# Patient Record
Sex: Male | Born: 1970 | Race: Black or African American | Hispanic: No | Marital: Single | Smoking: Never smoker
Health system: Southern US, Community
[De-identification: ages and names within clinical notes are randomized; demographics above are authoritative.]

## PROBLEM LIST (undated history)

## (undated) DIAGNOSIS — L719 Rosacea, unspecified: Secondary | ICD-10-CM

## (undated) DIAGNOSIS — D573 Sickle-cell trait: Secondary | ICD-10-CM

---
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
## 1999-11-07 ENCOUNTER — Encounter: Payer: Self-pay | Admitting: Emergency Medicine

## 1999-11-07 ENCOUNTER — Emergency Department (HOSPITAL_COMMUNITY): Admission: EM | Admit: 1999-11-07 | Discharge: 1999-11-07 | Payer: Self-pay | Admitting: Emergency Medicine

## 2001-08-26 ENCOUNTER — Emergency Department (HOSPITAL_COMMUNITY): Admission: EM | Admit: 2001-08-26 | Discharge: 2001-08-26 | Payer: Self-pay | Admitting: *Deleted

## 2006-05-06 ENCOUNTER — Emergency Department (HOSPITAL_COMMUNITY): Admission: EM | Admit: 2006-05-06 | Discharge: 2006-05-07 | Payer: Self-pay | Admitting: Emergency Medicine

## 2012-07-19 ENCOUNTER — Emergency Department (HOSPITAL_COMMUNITY): Payer: Federal, State, Local not specified - PPO

## 2012-07-19 ENCOUNTER — Encounter (HOSPITAL_COMMUNITY): Payer: Self-pay | Admitting: Emergency Medicine

## 2012-07-19 ENCOUNTER — Emergency Department (HOSPITAL_COMMUNITY)
Admission: EM | Admit: 2012-07-19 | Discharge: 2012-07-19 | Disposition: A | Discharge status: Home / Self Care | Payer: Federal, State, Local not specified - PPO | Attending: Emergency Medicine | Admitting: Emergency Medicine

## 2012-07-19 DIAGNOSIS — Z792 Long term (current) use of antibiotics: Secondary | ICD-10-CM | POA: Insufficient documentation

## 2012-07-19 DIAGNOSIS — R079 Chest pain, unspecified: Secondary | ICD-10-CM | POA: Insufficient documentation

## 2012-07-19 DIAGNOSIS — Z79899 Other long term (current) drug therapy: Secondary | ICD-10-CM | POA: Insufficient documentation

## 2012-07-19 DIAGNOSIS — R1011 Right upper quadrant pain: Secondary | ICD-10-CM | POA: Insufficient documentation

## 2012-07-19 DIAGNOSIS — R109 Unspecified abdominal pain: Secondary | ICD-10-CM

## 2012-07-19 DIAGNOSIS — Z202 Contact with and (suspected) exposure to infections with a predominantly sexual mode of transmission: Secondary | ICD-10-CM | POA: Insufficient documentation

## 2012-07-19 LAB — CBC
MCV: 82.6 fL (ref 78.0–100.0)
Platelets: 167 10*3/uL (ref 150–400)
RBC: 5.16 MIL/uL (ref 4.22–5.81)
WBC: 7.7 10*3/uL (ref 4.0–10.5)

## 2012-07-19 LAB — BASIC METABOLIC PANEL
CO2: 31 mEq/L (ref 19–32)
Chloride: 102 mEq/L (ref 96–112)
Creatinine, Ser: 1.09 mg/dL (ref 0.50–1.35)
GFR calc Af Amer: >90 mL/min (ref >90)
Potassium: 4 mEq/L (ref 3.5–5.1)
Sodium: 140 mEq/L (ref 135–145)

## 2012-07-19 LAB — POCT I-STAT TROPONIN I: Troponin i, poc: 0 ng/mL (ref 0.00–0.08)

## 2012-07-19 LAB — HEPATIC FUNCTION PANEL
ALT: 32 U/L (ref 0–53)
AST: 34 U/L (ref 0–37)
Albumin: 3.7 g/dL (ref 3.5–5.2)
Total Protein: 7 g/dL (ref 6.0–8.3)

## 2012-07-19 LAB — D-DIMER, QUANTITATIVE: D-Dimer, Quant: 0.41 ug/mL-FEU (ref 0.00–0.48)

## 2012-07-19 MED ORDER — SODIUM CHLORIDE 0.9 % IV SOLN
INTRAVENOUS | Status: DC
  Administered 2012-07-19: 10:00:00 via INTRAVENOUS

## 2012-07-19 MED ORDER — CEFTRIAXONE SODIUM 250 MG IJ SOLR
250.0000 mg | Freq: Once | INTRAMUSCULAR | Status: AC
  Administered 2012-07-19: 250 mg via INTRAMUSCULAR
  Filled 2012-07-19: qty 250

## 2012-07-19 MED ORDER — AZITHROMYCIN 1 G PO PACK
1.0000 g | PACK | Freq: Once | ORAL | Status: AC
  Administered 2012-07-19: 1 g via ORAL
  Filled 2012-07-19: qty 1

## 2012-07-19 MED ORDER — MORPHINE SULFATE 4 MG/ML IJ SOLN: 4.0000 mg | INTRAMUSCULAR | Status: DC | PRN

## 2012-07-19 MED ORDER — ONDANSETRON HCL 4 MG/2ML IJ SOLN: 4.0000 mg | INTRAMUSCULAR | Status: DC | PRN

## 2012-07-19 MED ORDER — HYDROCODONE-ACETAMINOPHEN 5-325 MG PO TABS: ORAL_TABLET | ORAL | Status: DC

## 2012-07-19 NOTE — ED Notes (Signed)
Pt c/o right sided CP that is in rib area and worse with inspiration starting yesterday; pt sts sharp in nature

## 2012-07-19 NOTE — ED Provider Notes (Signed)
History     CSN: [id number]  Arrival date & time 07/19/12  4098   First MD Initiated Contact with Patient 07/19/12 [phone]      Chief Complaint  Patient presents with  . Chest Pain     HPI Pt was seen at 1000.   Per pt, c/o gradual onset and persistence of multiple intermittent episodes of RUQ abd "pain" since yesterday.  States the pain began shortly after "drinking a protein shake" yesterday morning when he came home from working out at the gym.  Pt states the pain resolved a short while later. Pt states the pain returned later on that evening, again after drinking a protein shake after coming home from the gym.  Describes the pain as "sharp," and radiates into his right lower chest area. Denies vomiting/diarrhea, no fevers, no back/flank pain, no rash, no CP/SOB, no black or blood in stools or emesis.       History reviewed. No pertinent past medical history.  History reviewed. No pertinent past surgical history.    History  Substance Use Topics  . Smoking status: Never Smoker   . Smokeless tobacco: Not on file  . Alcohol Use: Yes     Comment: occ      Review of Systems ROS: Statement: All systems negative except as marked or noted in the HPI; Constitutional: Negative for fever and chills. ; ; Eyes: Negative for eye pain, redness and discharge. ; ; ENMT: Negative for ear pain, hoarseness, nasal congestion, sinus pressure and sore throat. ; ; Cardiovascular: Negative for chest pain, palpitations, diaphoresis, dyspnea and peripheral edema. ; ; Respiratory: Negative for cough, wheezing and stridor. ; ; Gastrointestinal: +abd pain. Negative for nausea, vomiting, diarrhea, blood in stool, hematemesis, jaundice and rectal bleeding. . ; ; Genitourinary: Negative for dysuria, flank pain and hematuria. ; ; Genital:  No penile drainage or rash, no testicular pain or swelling, no scrotal rash or swelling.;; Musculoskeletal: Negative for back pain and neck pain. Negative for swelling and  trauma.; ; Skin: Negative for pruritus, rash, abrasions, blisters, bruising and skin lesion.; ; Neuro: Negative for headache, lightheadedness and neck stiffness. Negative for weakness, altered level of consciousness , altered mental status, extremity weakness, paresthesias, involuntary movement, seizure and syncope.       Allergies  Review of patient's allergies indicates no known allergies.  Home Medications   Current Outpatient Rx  Name  Route  Sig  Dispense  Refill  . amoxicillin (AMOXIL) 500 MG capsule   Oral   Take 500 mg by mouth every 8 (eight) hours.         Marland Kitchen ibuprofen (ADVIL,MOTRIN) 200 MG tablet   Oral   Take 400 mg by mouth daily as needed (Inflammation).         Boris Lown Oil 500 MG CAPS   Oral   Take 1 capsule by mouth daily.         Marland Kitchen OVER THE COUNTER MEDICATION      200 mg. Tetris tribulus herbal supplement--testosterone booster for bodybuilding         . OVER THE COUNTER MEDICATION      3 drops 3 (three) times daily. Deer antler oil         . VITAMIN E PO   Oral   Take 1 tablet by mouth daily.           BP 117/67  Pulse 61  Temp(Src) 97.7 F (36.5 C) (Oral)  Resp 17  SpO2 97%  Physical Exam 1005: Physical examination:  Nursing notes reviewed; Vital signs and O2 SAT reviewed;  Constitutional: Well developed, Well nourished, Well hydrated, In no acute distress; Head:  Normocephalic, atraumatic; Eyes: EOMI, PERRL, No scleral icterus; ENMT: Mouth and pharynx normal, Mucous membranes moist; Neck: Supple, Full range of motion, No lymphadenopathy; Cardiovascular: Regular rate and rhythm, No murmur, rub, or gallop; Respiratory: Breath sounds clear & equal bilaterally, No rales, rhonchi, wheezes.  Speaking full sentences with ease, Normal respiratory effort/excursion; Chest: Nontender, Movement normal; Abdomen: Soft, +RUQ tenderness to palp. No rebound or guarding. Nondistended, Normal bowel sounds; Genitourinary: No CVA tenderness; Extremities: Pulses  normal, No tenderness, No edema, No calf edema or asymmetry.; Neuro: AA&Ox3, Major CN grossly intact.  Speech clear. No gross focal motor or sensory deficits in extremities.; Skin: Color normal, Warm, Dry.   ED Course  Procedures    MDM  MDM Reviewed: previous chart, nursing note and vitals Interpretation: ECG, labs, ultrasound and x-ray    Date: 07/19/2012  Rate: 71  Rhythm: normal sinus rhythm  QRS Axis: normal  Intervals: normal  ST/T Wave abnormalities: normal  Conduction Disutrbances:none  Narrative Interpretation:   Old EKG Reviewed: none available.  Results for orders placed during the hospital encounter of 07/19/12  CBC      Result Value Range   WBC 7.7  4.0 - 10.5 K/uL   RBC 5.16  4.22 - 5.81 MIL/uL   Hemoglobin 15.4  13.0 - 17.0 g/dL   HCT 16.1  09.6 - 04.5 %   MCV 82.6  78.0 - 100.0 fL   MCH 29.8  26.0 - 34.0 pg   MCHC 36.2 (*) 30.0 - 36.0 g/dL   RDW 40.9  81.1 - 91.4 %   Platelets 167  150 - 400 K/uL  BASIC METABOLIC PANEL      Result Value Range   Sodium 140  135 - 145 mEq/L   Potassium 4.0  3.5 - 5.1 mEq/L   Chloride 102  96 - 112 mEq/L   CO2 31  19 - 32 mEq/L   Glucose, Bld 99  70 - 99 mg/dL   BUN 20  6 - 23 mg/dL   Creatinine, Ser 7.82  0.50 - 1.35 mg/dL   Calcium 9.5  8.4 - 95.6 mg/dL   GFR calc non Af Amer 83 (*) >90 mL/min   GFR calc Af Amer >90  >90 mL/min  D-DIMER, QUANTITATIVE      Result Value Range   D-Dimer, Quant 0.41  0.00 - 0.48 ug/mL-FEU  HEPATIC FUNCTION PANEL      Result Value Range   Total Protein 7.0  6.0 - 8.3 g/dL   Albumin 3.7  3.5 - 5.2 g/dL   AST 34  0 - 37 U/L   ALT 32  0 - 53 U/L   Alkaline Phosphatase 50  39 - 117 U/L   Total Bilirubin 0.6  0.3 - 1.2 mg/dL   Bilirubin, Direct 0.1  0.0 - 0.3 mg/dL   Indirect Bilirubin 0.5  0.3 - 0.9 mg/dL  LIPASE, BLOOD      Result Value Range   Lipase 24  11 - 59 U/L  POCT I-STAT TROPONIN I      Result Value Range   Troponin i, poc 0.00  0.00 - 0.08 ng/mL   Comment 3             Dg Chest 2 View 07/19/2012   *RADIOLOGY REPORT*  Clinical Data: Lower chest pain  CHEST -  2 VIEW  Comparison: None.  Findings: Low volume chest exam.  Normal heart size and vascularity.  No pneumonia, CHF, effusion or pneumothorax.  Trachea midline.  IMPRESSION: Low volume exam.  No acute finding   Original Report Authenticated By: Judie Petit. Miles Costain, M.D.   US Abdomen Complete 07/19/2012   *RADIOLOGY REPORT*  Clinical Data:  Right upper quadrant pain  ABDOMINAL ULTRASOUND COMPLETE  Comparison:  None.  Findings:  Gallbladder:  No gallstones, gallbladder wall thickening, or pericholecystic fluid.  Common Bile Duct:  Within normal limits in caliber.  Liver: No focal mass lesion identified.  Within normal limits in parenchymal echogenicity.  IVC:  Appears normal.  Pancreas:  Completely obscured by midline bowel gas.  Spleen:  Within normal limits in size and echotexture.  Right kidney:  Normal in size and parenchymal echogenicity.  No evidence of mass or hydronephrosis.  Left kidney:  Normal in size and parenchymal echogenicity.  No evidence of mass or hydronephrosis.  Abdominal Aorta:  No aneurysm identified.  IMPRESSION: Negative for gallstones.  Normal gallbladder.  Obscured pancreas by bowel gas  No acute finding by ultrasound   Original Report Authenticated By: Judie Petit. Shick, M.D.    1450:  Pt states that he found out "just now" that his significant other "is being treated for an STD" and is requesting testing and tx while he is here today. Does now endorse that "sometimes it itches when I pee."  Denies testicular pain/swelling, no penile drainage, no dysuria/hematuria. With male ED Tech chaperone present, GC/chlam obtained and sent to the lab.  Pt has tol PO well while in the ED without N/V.  No stooling while in the ED.  Abd benign, VSS. Wants to go home now. Pt requesting to be tx prophylactically for STD exposure today. Dx and testing d/w pt.  Questions answered.  Verb understanding, agreeable to d/c home with outpt  f/u.              Laray Anger, DO 07/21/12 2251

## 2012-07-20 LAB — GC/CHLAMYDIA PROBE AMP: CT Probe RNA: NEGATIVE

## 2012-10-15 ENCOUNTER — Emergency Department (HOSPITAL_COMMUNITY)
Admission: EM | Admit: 2012-10-15 | Discharge: 2012-10-15 | Disposition: A | Discharge status: Home / Self Care | Payer: Federal, State, Local not specified - PPO | Attending: Emergency Medicine | Admitting: Emergency Medicine

## 2012-10-15 ENCOUNTER — Emergency Department (HOSPITAL_COMMUNITY): Payer: Federal, State, Local not specified - PPO

## 2012-10-15 ENCOUNTER — Encounter (HOSPITAL_COMMUNITY): Payer: Self-pay | Admitting: Emergency Medicine

## 2012-10-15 DIAGNOSIS — Z79899 Other long term (current) drug therapy: Secondary | ICD-10-CM | POA: Insufficient documentation

## 2012-10-15 DIAGNOSIS — Z23 Encounter for immunization: Secondary | ICD-10-CM | POA: Insufficient documentation

## 2012-10-15 DIAGNOSIS — S91309A Unspecified open wound, unspecified foot, initial encounter: Secondary | ICD-10-CM | POA: Insufficient documentation

## 2012-10-15 DIAGNOSIS — W208XXA Other cause of strike by thrown, projected or falling object, initial encounter: Secondary | ICD-10-CM | POA: Insufficient documentation

## 2012-10-15 DIAGNOSIS — Y92009 Unspecified place in unspecified non-institutional (private) residence as the place of occurrence of the external cause: Secondary | ICD-10-CM | POA: Insufficient documentation

## 2012-10-15 DIAGNOSIS — Y9389 Activity, other specified: Secondary | ICD-10-CM | POA: Insufficient documentation

## 2012-10-15 MED ORDER — TETANUS-DIPHTH-ACELL PERTUSSIS 5-2.5-18.5 LF-MCG/0.5 IM SUSP
0.5000 mL | Freq: Once | INTRAMUSCULAR | Status: AC
  Administered 2012-10-15: 0.5 mL via INTRAMUSCULAR
  Filled 2012-10-15: qty 0.5

## 2012-10-15 MED ORDER — CEPHALEXIN 500 MG PO CAPS: 500.0000 mg | ORAL_CAPSULE | Freq: Four times a day (QID) | ORAL | Status: DC

## 2012-10-15 MED ORDER — IBUPROFEN 600 MG PO TABS: 600.0000 mg | ORAL_TABLET | Freq: Four times a day (QID) | ORAL | Status: DC | PRN

## 2012-10-15 NOTE — ED Provider Notes (Signed)
CSN: [id number]     Arrival date & time 10/15/12  0306 History     First MD Initiated Contact with Patient 10/15/12 0345     Chief Complaint  Patient presents with  . Foot Injury   (Consider location/radiation/quality/duration/timing/severity/associated sxs/prior Treatment) HPI History provided by patient. At home tonight and dropped a ceramic plate onto his left foot. He was not wearing shoes and he sustained a small laceration over the dorsal medial aspect. No foreign body sensation. Bleeding controlled prior to arrival. Patient concerned because he is unable to move his left great toe. He believes he lacerated the tendon.  Pain is mild, sharp in quality and not radiating. No other injury or trauma. Last tetanus shot unknown. History reviewed. No pertinent past medical history. History reviewed. No pertinent past surgical history. No family history on file. History  Substance Use Topics  . Smoking status: Never Smoker   . Smokeless tobacco: Not on file  . Alcohol Use: Yes     Comment: occ    Review of Systems  Constitutional: Negative for fever and chills.  Respiratory: Negative for shortness of breath.   Cardiovascular: Negative for chest pain.  Gastrointestinal: Negative for abdominal pain.  Musculoskeletal: Negative for joint swelling.  Skin: Positive for wound.  Neurological: Negative for weakness and numbness.  All other systems reviewed and are negative.    Allergies  Review of patient's allergies indicates no known allergies.  Home Medications   Current Outpatient Rx  Name  Route  Sig  Dispense  Refill  . Krill Oil 500 MG CAPS   Oral   Take 1 capsule by mouth daily.         Marland Kitchen OVER THE COUNTER MEDICATION      200 mg. Tetris tribulus herbal supplement--testosterone booster for bodybuilding         . OVER THE COUNTER MEDICATION      3 drops 3 (three) times daily. Deer antler oil          BP 132/70  Pulse 64  Temp(Src) 98.1 F (36.7 C) (Oral)   Resp 19  Ht 6\' 2"  (1.88 m)  Wt 243 lb (110.224 kg)  BMI 31.19 kg/m2  SpO2 98% Physical Exam  Constitutional: He is oriented to person, place, and time. He appears well-developed and well-nourished.  HENT:  Head: Normocephalic and atraumatic.  Eyes: EOM are normal. Pupils are equal, round, and reactive to light.  Neck: Neck supple.  Cardiovascular: Regular rhythm and intact distal pulses.   Pulmonary/Chest: Effort normal. No respiratory distress.  Musculoskeletal:  Left foot with approximately a 1.5 cm laceration over the dorsum. There is an associated tendon laceration of the extensor hallux longus with distal motor deficit. Sensorium to light touch intact. Distal cap Refill intact.  Neurological: He is alert and oriented to person, place, and time.  Skin: Skin is warm and dry.    ED Course   LACERATION REPAIR Date/Time: 10/15/2012 5:34 AM Performed by: Sunnie Nielsen Authorized by: Sunnie Nielsen Consent: Verbal consent obtained. Risks and benefits: risks, benefits and alternatives were discussed Consent given by: patient Patient understanding: patient states understanding of the procedure being performed Patient consent: the patient's understanding of the procedure matches consent given Procedure consent: procedure consent matches procedure scheduled Required items: required blood products, implants, devices, and special equipment available Patient identity confirmed: verbally with patient Time out: Immediately prior to procedure a "time out" was called to verify the correct patient, procedure, equipment, support staff and site/side marked  as required. Body area: lower extremity Location details: left foot Laceration length: 1.5 cm Tendon involvement: complex Vascular damage: no Anesthesia: local infiltration Local anesthetic: lidocaine 1% without epinephrine Anesthetic total: 2 ml Preparation: Patient was prepped and draped in the usual sterile fashion. Irrigation solution:  saline Irrigation method: syringe Amount of cleaning: extensive Skin closure: 5-0 nylon Number of sutures: 2 Technique: simple Approximation: close Approximation difficulty: complex Dressing: antibiotic ointment Patient tolerance: Patient tolerated the procedure well with no immediate complications.   (including critical care time)  Labs Reviewed - No data to display Dg Foot Complete Left  10/15/2012   *RADIOLOGY REPORT*  Clinical Data: Foot injury.  LEFT FOOT - COMPLETE 3+ VIEW  Comparison: No priors.  Findings: Three views of the left foot demonstrate no acute displaced fracture, subluxation, dislocation, joint or soft tissue abnormality.  IMPRESSION: 1.  No acute radiographic abnormality of the left foot.   Original Report Authenticated By: Trudie Reed, M.D.   Skin closure of wound. Unable to visualize the distal tendon with complete laceration. I am unable to visualize the proximal tendon of the left extensor hallux longus. Nonadhesive sterile dressing placed  5:32 AM d/w Ortho, Dr Lajoyce Corners, plan Fx boot and will see PT in the office for follow up.   MDM  Left foot tendon laceration X-ray. Tetanus updated Wound irrigated extensively Wound closure Fracture boot Orthopedic referral Vital signs/ nurse's notes reviewed  Sunnie Nielsen, MD 10/15/12 [phone]

## 2012-10-15 NOTE — ED Notes (Addendum)
PT. PRESENTS WITH LACERATION AT LEFT FOOT APPROX. 1 INCH , CERAMIC PLATE FELL AND HIT TOP OF HIS LEFT FOOT THIS EVENING , NO BLEEDING AT THIS TIME .

## 2012-10-15 NOTE — ED Notes (Signed)
Ortho tech paged for larger post op boot.

## 2012-10-15 NOTE — Progress Notes (Signed)
Orthopedic Tech Progress Note Patient Details:  Erik Diaz August 30, 1970 [id number]  Ortho Devices Type of Ortho Device: Postop shoe/boot   Haskell Flirt 10/15/2012, 6:19 AM

## 2012-10-17 ENCOUNTER — Encounter (HOSPITAL_COMMUNITY): Payer: Self-pay | Admitting: *Deleted

## 2012-10-17 ENCOUNTER — Other Ambulatory Visit (HOSPITAL_COMMUNITY): Payer: Self-pay | Admitting: Orthopedic Surgery

## 2012-10-17 MED ORDER — CEFAZOLIN SODIUM-DEXTROSE 2-3 GM-% IV SOLR
2.0000 g | INTRAVENOUS | Status: AC
  Administered 2012-10-18: 2 g via INTRAVENOUS
  Filled 2012-10-17 (×2): qty 50

## 2012-10-18 ENCOUNTER — Encounter (HOSPITAL_COMMUNITY): Payer: Self-pay | Admitting: Certified Registered Nurse Anesthetist

## 2012-10-18 ENCOUNTER — Ambulatory Visit (HOSPITAL_COMMUNITY)
Admission: RE | Admit: 2012-10-18 | Discharge: 2012-10-18 | Disposition: A | Discharge status: Home / Self Care | Payer: Federal, State, Local not specified - PPO | Source: Ambulatory Visit | Attending: Orthopedic Surgery | Admitting: Orthopedic Surgery

## 2012-10-18 ENCOUNTER — Encounter (HOSPITAL_COMMUNITY)
Admission: RE | Disposition: A | Discharge status: Home / Self Care | Payer: Self-pay | Source: Ambulatory Visit | Attending: Orthopedic Surgery

## 2012-10-18 ENCOUNTER — Ambulatory Visit (HOSPITAL_COMMUNITY): Payer: Federal, State, Local not specified - PPO | Admitting: Certified Registered Nurse Anesthetist

## 2012-10-18 ENCOUNTER — Encounter (HOSPITAL_COMMUNITY): Payer: Self-pay | Admitting: Certified Registered"

## 2012-10-18 DIAGNOSIS — S91109A Unspecified open wound of unspecified toe(s) without damage to nail, initial encounter: Secondary | ICD-10-CM | POA: Insufficient documentation

## 2012-10-18 DIAGNOSIS — L719 Rosacea, unspecified: Secondary | ICD-10-CM | POA: Insufficient documentation

## 2012-10-18 DIAGNOSIS — S96909A Unspecified injury of unspecified muscle and tendon at ankle and foot level, unspecified foot, initial encounter: Secondary | ICD-10-CM | POA: Insufficient documentation

## 2012-10-18 DIAGNOSIS — W268XXA Contact with other sharp object(s), not elsewhere classified, initial encounter: Secondary | ICD-10-CM | POA: Insufficient documentation

## 2012-10-18 DIAGNOSIS — Y929 Unspecified place or not applicable: Secondary | ICD-10-CM | POA: Insufficient documentation

## 2012-10-18 DIAGNOSIS — D573 Sickle-cell trait: Secondary | ICD-10-CM | POA: Insufficient documentation

## 2012-10-18 HISTORY — DX: Rosacea, unspecified: L71.9

## 2012-10-18 HISTORY — DX: Sickle-cell trait: D57.3

## 2012-10-18 HISTORY — PX: REPAIR EXTENSOR TENDON: SHX5382

## 2012-10-18 LAB — COMPREHENSIVE METABOLIC PANEL
ALT: 27 U/L (ref 0–53)
AST: 33 U/L (ref 0–37)
CO2: 31 mEq/L (ref 19–32)
Calcium: 9.7 mg/dL (ref 8.4–10.5)
GFR calc non Af Amer: 70 mL/min — ABNORMAL LOW (ref >90)
Sodium: 139 mEq/L (ref 135–145)
Total Protein: 6.8 g/dL (ref 6.0–8.3)

## 2012-10-18 LAB — CBC
MCH: 30.3 pg (ref 26.0–34.0)
Platelets: 154 10*3/uL (ref 150–400)
RBC: 5.11 MIL/uL (ref 4.22–5.81)
WBC: 5 10*3/uL (ref 4.0–10.5)

## 2012-10-18 SURGERY — REPAIR, TENDON, EXTENSOR
Anesthesia: General | Site: Foot | Laterality: Left | Wound class: Clean

## 2012-10-18 MED ORDER — HYDROCODONE-ACETAMINOPHEN 5-325 MG PO TABS: 1.0000 | ORAL_TABLET | Freq: Four times a day (QID) | ORAL | Status: DC | PRN

## 2012-10-18 MED ORDER — OXYCODONE HCL 5 MG PO TABS: 5.0000 mg | ORAL_TABLET | Freq: Once | ORAL | Status: DC | PRN

## 2012-10-18 MED ORDER — LACTATED RINGERS IV SOLN
INTRAVENOUS | Status: DC
  Administered 2012-10-18: 13:00:00 via INTRAVENOUS

## 2012-10-18 MED ORDER — HYDROMORPHONE HCL PF 1 MG/ML IJ SOLN
0.2500 mg | INTRAMUSCULAR | Status: DC | PRN
  Administered 2012-10-18: 0.5 mg via INTRAVENOUS

## 2012-10-18 MED ORDER — OXYCODONE HCL 5 MG/5ML PO SOLN: 5.0000 mg | Freq: Once | ORAL | Status: DC | PRN

## 2012-10-18 MED ORDER — ONDANSETRON HCL 4 MG/2ML IJ SOLN
INTRAMUSCULAR | Status: DC | PRN
  Administered 2012-10-18: 4 mg via INTRAVENOUS

## 2012-10-18 MED ORDER — FENTANYL CITRATE 0.05 MG/ML IJ SOLN
INTRAMUSCULAR | Status: DC | PRN
  Administered 2012-10-18: 150 ug via INTRAVENOUS

## 2012-10-18 MED ORDER — BUPIVACAINE HCL (PF) 0.5 % IJ SOLN
INTRAMUSCULAR | Status: AC
  Filled 2012-10-18: qty 30

## 2012-10-18 MED ORDER — LIDOCAINE HCL (CARDIAC) 20 MG/ML IV SOLN
INTRAVENOUS | Status: DC | PRN
  Administered 2012-10-18: 40 mg via INTRAVENOUS

## 2012-10-18 MED ORDER — HYDROMORPHONE HCL PF 1 MG/ML IJ SOLN
INTRAMUSCULAR | Status: AC
  Filled 2012-10-18: qty 1

## 2012-10-18 MED ORDER — PROPOFOL 10 MG/ML IV BOLUS
INTRAVENOUS | Status: DC | PRN
  Administered 2012-10-18: 200 mg via INTRAVENOUS

## 2012-10-18 MED ORDER — MIDAZOLAM HCL 5 MG/5ML IJ SOLN
INTRAMUSCULAR | Status: DC | PRN
  Administered 2012-10-18: 2 mg via INTRAVENOUS

## 2012-10-18 MED ORDER — CEFAZOLIN SODIUM-DEXTROSE 2-3 GM-% IV SOLR: 2.0000 g | INTRAVENOUS | Status: DC

## 2012-10-18 MED ORDER — 0.9 % SODIUM CHLORIDE (POUR BTL) OPTIME
TOPICAL | Status: DC | PRN
  Administered 2012-10-18: 1000 mL

## 2012-10-18 SURGICAL SUPPLY — 53 items
BANDAGE GAUZE ELAST BULKY 4 IN (GAUZE/BANDAGES/DRESSINGS) ×2 IMPLANT
BLADE SURG 10 STRL SS (BLADE) ×2 IMPLANT
BNDG COHESIVE 6X5 TAN STRL LF (GAUZE/BANDAGES/DRESSINGS) ×2 IMPLANT
BNDG ESMARK 4X9 LF (GAUZE/BANDAGES/DRESSINGS) ×2 IMPLANT
BNDG GAUZE STRTCH 6 (GAUZE/BANDAGES/DRESSINGS) IMPLANT
BUPIVICAINE 0.5% IMPLANT
CLOTH BEACON ORANGE TIMEOUT ST (SAFETY) ×2 IMPLANT
COTTON STERILE ROLL (GAUZE/BANDAGES/DRESSINGS) IMPLANT
CUFF TOURNIQUET SINGLE 34IN LL (TOURNIQUET CUFF) IMPLANT
CUFF TOURNIQUET SINGLE 44IN (TOURNIQUET CUFF) IMPLANT
DRAPE INCISE IOBAN 66X45 STRL (DRAPES) IMPLANT
DRAPE U-SHAPE 47X51 STRL (DRAPES) ×2 IMPLANT
DRSG ADAPTIC 3X8 NADH LF (GAUZE/BANDAGES/DRESSINGS) ×2 IMPLANT
DRSG PAD ABDOMINAL 8X10 ST (GAUZE/BANDAGES/DRESSINGS) ×2 IMPLANT
DURAPREP 26ML APPLICATOR (WOUND CARE) ×4 IMPLANT
ELECT REM PT RETURN 9FT ADLT (ELECTROSURGICAL) ×2
ELECTRODE REM PT RTRN 9FT ADLT (ELECTROSURGICAL) ×1 IMPLANT
GLOVE BIO SURGEON STRL SZ 6.5 (GLOVE) ×2 IMPLANT
GLOVE BIOGEL PI IND STRL 7.5 (GLOVE) ×1 IMPLANT
GLOVE BIOGEL PI IND STRL 9 (GLOVE) ×1 IMPLANT
GLOVE BIOGEL PI INDICATOR 7.5 (GLOVE) ×1
GLOVE BIOGEL PI INDICATOR 9 (GLOVE) ×1
GLOVE SURG ORTHO 9.0 STRL STRW (GLOVE) ×2 IMPLANT
GLOVE SURG SS PI 8.5 STRL IVOR (GLOVE) ×1
GLOVE SURG SS PI 8.5 STRL STRW (GLOVE) ×1 IMPLANT
GOWN PREVENTION PLUS XLARGE (GOWN DISPOSABLE) IMPLANT
GOWN SRG XL XLNG 56XLVL 4 (GOWN DISPOSABLE) ×2 IMPLANT
GOWN STRL NON-REIN XL XLG LVL4 (GOWN DISPOSABLE) ×2
KIT ROOM TURNOVER OR (KITS) ×2 IMPLANT
MANIFOLD NEPTUNE II (INSTRUMENTS) ×2 IMPLANT
NDL SUT .5 MAYO 1.404X.05X (NEEDLE) IMPLANT
NEEDLE 22X1 1/2 (OR ONLY) (NEEDLE) ×2 IMPLANT
NEEDLE MAYO TAPER (NEEDLE)
NS IRRIG 1000ML POUR BTL (IV SOLUTION) ×2 IMPLANT
PACK ORTHO EXTREMITY (CUSTOM PROCEDURE TRAY) ×2 IMPLANT
PAD ARMBOARD 7.5X6 YLW CONV (MISCELLANEOUS) ×2 IMPLANT
PADDING CAST ABS 4INX4YD NS (CAST SUPPLIES) ×1
PADDING CAST ABS COTTON 4X4 ST (CAST SUPPLIES) ×1 IMPLANT
SPONGE GAUZE 4X4 12PLY (GAUZE/BANDAGES/DRESSINGS) ×2 IMPLANT
SPONGE LAP 18X18 X RAY DECT (DISPOSABLE) ×2 IMPLANT
SPONGE LAP 4X18 X RAY DECT (DISPOSABLE) IMPLANT
SUT ETHILON 3 0 FSLX (SUTURE) ×2 IMPLANT
SUT FIBERWIRE #2 38 T-5 BLUE (SUTURE)
SUT FIBERWIRE 2-0 18 17.9 3/8 (SUTURE) ×2
SUT MNCRL AB 3-0 PS2 18 (SUTURE) IMPLANT
SUTURE FIBERWR #2 38 T-5 BLUE (SUTURE) IMPLANT
SUTURE FIBERWR 2-0 18 17.9 3/8 (SUTURE) ×1 IMPLANT
SYR CONTROL 10ML LL (SYRINGE) ×2 IMPLANT
TOWEL OR 17X24 6PK STRL BLUE (TOWEL DISPOSABLE) ×2 IMPLANT
TOWEL OR 17X26 10 PK STRL BLUE (TOWEL DISPOSABLE) ×2 IMPLANT
TUBE CONNECTING 12X1/4 (SUCTIONS) ×2 IMPLANT
WATER STERILE IRR 1000ML POUR (IV SOLUTION) IMPLANT
YANKAUER SUCT BULB TIP NO VENT (SUCTIONS) ×2 IMPLANT

## 2012-10-18 NOTE — Anesthesia Postprocedure Evaluation (Signed)
  Anesthesia Post-op Note  Patient: Erik Diaz  Procedure(s) Performed: Procedure(s) with comments: Repair Left Foot Great Toe Extensor Tendon (Left) - Repair Left Foot Great Toe Extensor Tendon  Patient Location: PACU  Anesthesia Type:General  Level of Consciousness: awake, alert  and oriented  Airway and Oxygen Therapy: Patient Spontanous Breathing  Post-op Pain: mild  Post-op Assessment: Post-op Vital signs reviewed, Patient's Cardiovascular Status Stable, Respiratory Function Stable, Patent Airway and No signs of Nausea or vomiting  Post-op Vital Signs: Reviewed and stable  Complications: No apparent anesthesia complications

## 2012-10-18 NOTE — H&P (Signed)
Lucille Crichlow is an 42 y.o. male.   Chief Complaint: Unable to extend left great toe HPI:  Patient is a 42 year old gentleman who dropped a plate on the dorsum of his left foot sustaining complete laceration of the left great toe EHL.  Past Medical History  Diagnosis Date  . Sickle cell trait   . Rosacea     Past Surgical History  Procedure Laterality Date  . Finger fracture surgery Right 1990    History reviewed. No pertinent family history. Social History:  reports that he has never smoked. He has never used smokeless tobacco. He reports that  drinks alcohol. He reports that he does not use illicit drugs.  Allergies: No Known Allergies  No prescriptions prior to admission    No results found for this or any previous visit (from the past 48 hour(s)). No results found.  Review of Systems  All other systems reviewed and are negative.    There were no vitals taken for this visit. Physical Exam  On examination patient has a palpable dorsalis pedis pulse. He has complete absence of EHL function of the left foot great toe Assessment/Plan Assessment: Complete laceration EHL tendon left foot great toe.  Plan: We will plan for reconstruction of the EHL tendon. Risks and benefits were discussed including infection neurovascular injury rerupture the tendon need for additional surgery. Patient states he understands and wished to proceed at this time.  Joscelin Fray V 10/18/2012, 6:22 AM

## 2012-10-18 NOTE — Anesthesia Preprocedure Evaluation (Addendum)
Anesthesia Evaluation  Patient identified by MRN, date of birth, ID band Patient awake    Reviewed: Allergy & Precautions, H&P , NPO status , Patient's Chart, lab work & pertinent test results  Airway Mallampati: I TM Distance: >3 FB Neck ROM: Full    Dental no notable dental hx. (+) Teeth Intact and Dental Advisory Given   Pulmonary neg pulmonary ROS,  breath sounds clear to auscultation  Pulmonary exam normal       Cardiovascular negative cardio ROS  Rhythm:Regular Rate:Normal     Neuro/Psych negative neurological ROS  negative psych ROS   GI/Hepatic negative GI ROS, Neg liver ROS,   Endo/Other  negative endocrine ROS  Renal/GU negative Renal ROS  negative genitourinary   Musculoskeletal   Abdominal   Peds  Hematology negative hematology ROS (+) Sickle cell trait ,   Anesthesia Other Findings   Reproductive/Obstetrics negative OB ROS                          Anesthesia Physical Anesthesia Plan  ASA: II  Anesthesia Plan: General   Post-op Pain Management:    Induction: Intravenous  Airway Management Planned: LMA  Additional Equipment:   Intra-op Plan:   Post-operative Plan: Extubation in OR  Informed Consent: I have reviewed the patients History and Physical, chart, labs and discussed the procedure including the risks, benefits and alternatives for the proposed anesthesia with the patient or authorized representative who has indicated his/her understanding and acceptance.   Dental advisory given  Plan Discussed with: CRNA  Anesthesia Plan Comments:         Anesthesia Quick Evaluation

## 2012-10-18 NOTE — Anesthesia Procedure Notes (Signed)
Procedure Name: LMA Insertion Date/Time: 10/18/2012 2:26 PM Performed by: Arlice Colt B Pre-anesthesia Checklist: Emergency Drugs available, Patient identified, Suction available, Patient being monitored and Timeout performed Patient Re-evaluated:Patient Re-evaluated prior to inductionOxygen Delivery Method: Circle system utilized Preoxygenation: Pre-oxygenation with 100% oxygen Intubation Type: IV induction LMA: LMA inserted Number of attempts: 1 Placement Confirmation: ETT inserted through vocal cords under direct vision,  positive ETCO2 and breath sounds checked- equal and bilateral Tube secured with: Tape Dental Injury: Teeth and Oropharynx as per pre-operative assessment

## 2012-10-18 NOTE — Transfer of Care (Signed)
Immediate Anesthesia Transfer of Care Note  Patient: Erik Diaz  Procedure(s) Performed: Procedure(s) with comments: Repair Left Foot Great Toe Extensor Tendon (Left) - Repair Left Foot Great Toe Extensor Tendon  Patient Location: PACU  Anesthesia Type:General  Level of Consciousness: responds to stimulation  Airway & Oxygen Therapy: Patient Spontanous Breathing and Patient connected to nasal cannula oxygen  Post-op Assessment: Report given to PACU RN and Post -op Vital signs reviewed and stable  Post vital signs: Reviewed and stable  Complications: No apparent anesthesia complications

## 2012-10-20 NOTE — Op Note (Signed)
OPERATIVE REPORT  DATE OF SURGERY: 10/20/2012  PATIENT:  Erik Diaz,  42 y.o. male  PRE-OPERATIVE DIAGNOSIS:  Left Foot Great Toe Extensor Tendon Laceration  POST-OPERATIVE DIAGNOSIS:  Left Foot Great Toe Extensor Tendon Laceration  PROCEDURE:  Procedure(s): Repair Left Foot Great Toe Extensor Tendon  SURGEON:  Surgeon(s): Nadara Mustard, MD  ANESTHESIA:   general  EBL:  min ML  SPECIMEN:  No Specimen  TOURNIQUET:  * No tourniquets in log *  PROCEDURE DETAILS: Patient is a 42 year old gentleman who sustained a traumatic laceration to the EHL left great toe over the mid dorsum of the foot secondary to a plate dropping on his foot. Patient had complete absence of the EHL and presents at this time for surgical reconstruction. Risks and benefits were discussed patient states he understands and wished to proceed at this time. Description of procedure patient was brought to the operating room underwent general anesthetic. After adequate levels of anesthesia were obtained patient's left lower extremity was prepped using DuraPrep and draped into a sterile field. A longitudinal incision was made directly over the traumatic laceration. The traumatic laceration was ellipsed out with the incision. The ends of the EHL were repaired using a Kessler suture technique with 20 FiberWire. The tendon had a good apposition after repair. The wound is irrigated with normal saline. The skin was closed using 2-0 nylon. The wound was covered with Adaptic orthopedic sponges ABDs dressing and a posterior splint was applied with the ankle dorsiflexed and the great toe dorsiflexed to take tension off the repair. Patient was extubated taken to the PACU in stable condition plan for discharge to home.  PLAN OF CARE: Discharge to home after PACU  PATIENT DISPOSITION:  PACU - hemodynamically stable.   Nadara Mustard, MD 10/20/2012 2:19 PM

## 2012-10-23 ENCOUNTER — Encounter (HOSPITAL_COMMUNITY): Payer: Self-pay | Admitting: Orthopedic Surgery

## 2012-10-28 ENCOUNTER — Other Ambulatory Visit (HOSPITAL_COMMUNITY): Payer: Self-pay | Admitting: Orthopedic Surgery

## 2012-10-29 ENCOUNTER — Encounter (HOSPITAL_COMMUNITY): Payer: Self-pay | Admitting: *Deleted

## 2012-10-29 ENCOUNTER — Encounter (HOSPITAL_COMMUNITY): Payer: Self-pay | Admitting: Pharmacy Technician

## 2012-10-29 MED ORDER — CEFAZOLIN SODIUM-DEXTROSE 2-3 GM-% IV SOLR
2.0000 g | INTRAVENOUS | Status: AC
  Administered 2012-10-30: 2 g via INTRAVENOUS
  Filled 2012-10-29: qty 50

## 2012-10-29 NOTE — Progress Notes (Signed)
Pt denies SOB, chest pain, and being under the care of a cardiologist. Pt denies having an echo, stress, and cardiac catheterization. Pt made aware to stop taking Aspirin, vitamins, and herbal medications ( fish oil, krill oil, deer antler oil Tetris Tribulus). Do not take any NSAIDs ie: Ibuprofen, Advil, Naproxen or any medication containing Aspirin.

## 2012-10-30 ENCOUNTER — Ambulatory Visit (HOSPITAL_COMMUNITY): Payer: Federal, State, Local not specified - PPO | Admitting: Certified Registered"

## 2012-10-30 ENCOUNTER — Ambulatory Visit (HOSPITAL_COMMUNITY)
Admission: RE | Admit: 2012-10-30 | Discharge: 2012-10-30 | Disposition: A | Discharge status: Home / Self Care | Payer: Federal, State, Local not specified - PPO | Source: Ambulatory Visit | Attending: Orthopedic Surgery | Admitting: Orthopedic Surgery

## 2012-10-30 ENCOUNTER — Encounter (HOSPITAL_COMMUNITY): Payer: Self-pay | Admitting: Certified Registered"

## 2012-10-30 ENCOUNTER — Encounter (HOSPITAL_COMMUNITY)
Admission: RE | Disposition: A | Discharge status: Home / Self Care | Payer: Self-pay | Source: Ambulatory Visit | Attending: Orthopedic Surgery

## 2012-10-30 DIAGNOSIS — S91312D Laceration without foreign body, left foot, subsequent encounter: Secondary | ICD-10-CM

## 2012-10-30 DIAGNOSIS — S93699A Other sprain of unspecified foot, initial encounter: Secondary | ICD-10-CM | POA: Insufficient documentation

## 2012-10-30 DIAGNOSIS — X500XXA Overexertion from strenuous movement or load, initial encounter: Secondary | ICD-10-CM | POA: Insufficient documentation

## 2012-10-30 HISTORY — PX: TENDON REPAIR: SHX5111

## 2012-10-30 SURGERY — TENDON REPAIR
Anesthesia: General | Site: Foot | Laterality: Left | Wound class: Clean

## 2012-10-30 MED ORDER — 0.9 % SODIUM CHLORIDE (POUR BTL) OPTIME
TOPICAL | Status: DC | PRN
  Administered 2012-10-30: 1000 mL

## 2012-10-30 MED ORDER — ARTIFICIAL TEARS OP OINT
TOPICAL_OINTMENT | OPHTHALMIC | Status: DC | PRN
  Administered 2012-10-30: 1 via OPHTHALMIC

## 2012-10-30 MED ORDER — KETOROLAC TROMETHAMINE 30 MG/ML IJ SOLN
INTRAMUSCULAR | Status: AC
  Filled 2012-10-30: qty 1

## 2012-10-30 MED ORDER — HYDROMORPHONE HCL PF 1 MG/ML IJ SOLN
INTRAMUSCULAR | Status: AC
  Filled 2012-10-30: qty 1

## 2012-10-30 MED ORDER — FENTANYL CITRATE 0.05 MG/ML IJ SOLN
INTRAMUSCULAR | Status: DC | PRN
  Administered 2012-10-30: 100 ug via INTRAVENOUS
  Administered 2012-10-30: 50 ug via INTRAVENOUS

## 2012-10-30 MED ORDER — BUPIVACAINE HCL 0.5 % IJ SOLN
INTRAMUSCULAR | Status: DC | PRN
  Administered 2012-10-30: 10 mL

## 2012-10-30 MED ORDER — HYDROMORPHONE HCL PF 1 MG/ML IJ SOLN
0.2500 mg | INTRAMUSCULAR | Status: DC | PRN
  Administered 2012-10-30 (×2): 0.5 mg via INTRAVENOUS

## 2012-10-30 MED ORDER — MIDAZOLAM HCL 5 MG/5ML IJ SOLN
INTRAMUSCULAR | Status: DC | PRN
  Administered 2012-10-30: 2 mg via INTRAVENOUS

## 2012-10-30 MED ORDER — BUPIVACAINE HCL (PF) 0.5 % IJ SOLN
INTRAMUSCULAR | Status: AC
  Filled 2012-10-30: qty 30

## 2012-10-30 MED ORDER — LACTATED RINGERS IV SOLN
INTRAVENOUS | Status: DC
  Administered 2012-10-30: 08:00:00 via INTRAVENOUS

## 2012-10-30 MED ORDER — ONDANSETRON HCL 4 MG/2ML IJ SOLN: 4.0000 mg | Freq: Once | INTRAMUSCULAR | Status: DC | PRN

## 2012-10-30 MED ORDER — PROPOFOL 10 MG/ML IV BOLUS
INTRAVENOUS | Status: DC | PRN
  Administered 2012-10-30: 200 mg via INTRAVENOUS

## 2012-10-30 MED ORDER — OXYCODONE HCL 5 MG PO TABS
ORAL_TABLET | ORAL | Status: AC
  Filled 2012-10-30: qty 1

## 2012-10-30 MED ORDER — KETOROLAC TROMETHAMINE 30 MG/ML IJ SOLN
15.0000 mg | Freq: Once | INTRAMUSCULAR | Status: AC | PRN
  Administered 2012-10-30: 15 mg via INTRAVENOUS

## 2012-10-30 MED ORDER — ONDANSETRON HCL 4 MG/2ML IJ SOLN
INTRAMUSCULAR | Status: DC | PRN
  Administered 2012-10-30: 4 mg via INTRAVENOUS

## 2012-10-30 MED ORDER — LIDOCAINE HCL (CARDIAC) 20 MG/ML IV SOLN
INTRAVENOUS | Status: DC | PRN
  Administered 2012-10-30: 70 mg via INTRAVENOUS

## 2012-10-30 MED ORDER — OXYCODONE HCL 5 MG PO TABS
5.0000 mg | ORAL_TABLET | Freq: Once | ORAL | Status: AC | PRN
  Administered 2012-10-30: 5 mg via ORAL

## 2012-10-30 MED ORDER — OXYCODONE HCL 5 MG/5ML PO SOLN: 5.0000 mg | Freq: Once | ORAL | Status: AC | PRN

## 2012-10-30 MED ORDER — HYDROCODONE-ACETAMINOPHEN 5-325 MG PO TABS: 1.0000 | ORAL_TABLET | Freq: Four times a day (QID) | ORAL | Status: AC | PRN

## 2012-10-30 SURGICAL SUPPLY — 39 items
BNDG COHESIVE 6X5 TAN STRL LF (GAUZE/BANDAGES/DRESSINGS) ×2 IMPLANT
BNDG ESMARK 4X9 LF (GAUZE/BANDAGES/DRESSINGS) ×2 IMPLANT
CLOTH BEACON ORANGE TIMEOUT ST (SAFETY) ×2 IMPLANT
CORDS BIPOLAR (ELECTRODE) ×2 IMPLANT
COVER SURGICAL LIGHT HANDLE (MISCELLANEOUS) ×2 IMPLANT
CUFF TOURNIQUET SINGLE 18IN (TOURNIQUET CUFF) ×2 IMPLANT
CUFF TOURNIQUET SINGLE 24IN (TOURNIQUET CUFF) IMPLANT
DRAPE U-SHAPE 47X51 STRL (DRAPES) ×2 IMPLANT
DRSG ADAPTIC 3X8 NADH LF (GAUZE/BANDAGES/DRESSINGS) IMPLANT
DRSG EMULSION OIL 3X3 NADH (GAUZE/BANDAGES/DRESSINGS) ×2 IMPLANT
DRSG PAD ABDOMINAL 8X10 ST (GAUZE/BANDAGES/DRESSINGS) ×2 IMPLANT
DURAPREP 26ML APPLICATOR (WOUND CARE) ×2 IMPLANT
ELECT REM PT RETURN 9FT ADLT (ELECTROSURGICAL) ×2
ELECTRODE REM PT RTRN 9FT ADLT (ELECTROSURGICAL) ×1 IMPLANT
GLOVE BIOGEL PI IND STRL 9 (GLOVE) ×1 IMPLANT
GLOVE BIOGEL PI INDICATOR 9 (GLOVE) ×1
GLOVE SURG ORTHO 9.0 STRL STRW (GLOVE) ×2 IMPLANT
GOWN PREVENTION PLUS XLARGE (GOWN DISPOSABLE) ×2 IMPLANT
GOWN SRG XL XLNG 56XLVL 4 (GOWN DISPOSABLE) ×1 IMPLANT
GOWN STRL NON-REIN XL XLG LVL4 (GOWN DISPOSABLE) ×1
KIT BASIN OR (CUSTOM PROCEDURE TRAY) ×2 IMPLANT
KIT ROOM TURNOVER OR (KITS) ×2 IMPLANT
NEEDLE 22X1 1/2 (OR ONLY) (NEEDLE) ×2 IMPLANT
NEEDLE HYPO 25GX1X1/2 BEV (NEEDLE) IMPLANT
NS IRRIG 1000ML POUR BTL (IV SOLUTION) ×2 IMPLANT
PACK ORTHO EXTREMITY (CUSTOM PROCEDURE TRAY) ×2 IMPLANT
PAD ARMBOARD 7.5X6 YLW CONV (MISCELLANEOUS) ×4 IMPLANT
PAD CAST 4YDX4 CTTN HI CHSV (CAST SUPPLIES) ×1 IMPLANT
PADDING CAST COTTON 4X4 STRL (CAST SUPPLIES) ×1
PADDING CAST COTTON 6X4 STRL (CAST SUPPLIES) ×2 IMPLANT
SPONGE GAUZE 4X4 12PLY (GAUZE/BANDAGES/DRESSINGS) ×2 IMPLANT
SUCTION FRAZIER TIP 10 FR DISP (SUCTIONS) IMPLANT
SUT ETHILON 2 0 FS 18 (SUTURE) IMPLANT
SUT VIC AB 2-0 FS1 27 (SUTURE) IMPLANT
SYR CONTROL 10ML LL (SYRINGE) ×2 IMPLANT
TOWEL OR 17X24 6PK STRL BLUE (TOWEL DISPOSABLE) ×2 IMPLANT
TOWEL OR 17X26 10 PK STRL BLUE (TOWEL DISPOSABLE) ×2 IMPLANT
TUBE CONNECTING 12X1/4 (SUCTIONS) IMPLANT
WATER STERILE IRR 1000ML POUR (IV SOLUTION) ×2 IMPLANT

## 2012-10-30 NOTE — Progress Notes (Signed)
Orthopedic Tech Progress Note Patient Details:  Erik Diaz May 29, 1970 [id number]  Ortho Devices Type of Ortho Device: Crutches Ortho Device/Splint Interventions: Application   Shawnie Pons 10/30/2012, 11:59 AM

## 2012-10-30 NOTE — H&P (Signed)
Erik Diaz is an 42 y.o. male.   Chief Complaint: Rerupture of EHL repair left great toe HPI: Patient states he was ambulating and a dentist office when he reruptured the EHL tendon.  Past Medical History  Diagnosis Date  . Sickle cell trait   . Rosacea     Past Surgical History  Procedure Laterality Date  . Finger fracture surgery Right 1990  . Repair extensor tendon Left 10/18/2012    Procedure: Repair Left Foot Great Toe Extensor Tendon;  Surgeon: Nadara Mustard, MD;  Location: [facility] OR;  Service: Orthopedics;  Laterality: Left;  Repair Left Foot Great Toe Extensor Tendon    Family History  Problem Relation Age of Onset  . High Cholesterol Father    Social History:  reports that he has never smoked. He has never used smokeless tobacco. He reports that  drinks alcohol. He reports that he does not use illicit drugs.  Allergies:  Allergies  Allergen Reactions  . Antibacterial Hand Soap [Triclosan]     Causes burning due to Rosecea    No prescriptions prior to admission    No results found for this or any previous visit (from the past 48 hour(s)). No results found.  Review of Systems  All other systems reviewed and are negative.    Height 6\' 2"  (1.88 m), weight 111.131 kg (245 lb). Physical Exam   On examination patient does not have active extension of the EHL. There is complete rupture of the EHL tendon repair. Assessment/Plan Assessment: Rupture EHL tendon repair.  Plan: We'll revise the EHL tendon repair. Risks and benefits were discussed including infection neurovascular injury weakness need for additional surgery. Patient states he understands and wished to proceed at this time.  Phuoc Huy V 10/30/2012, 6:40 AM

## 2012-10-30 NOTE — Anesthesia Preprocedure Evaluation (Addendum)
Anesthesia Evaluation  Patient identified by MRN, date of birth, ID band Patient awake    Reviewed: Allergy & Precautions, H&P , NPO status , Patient's Chart, lab work & pertinent test results  Airway Mallampati: I TM Distance: >3 FB     Dental  (+) Teeth Intact and Dental Advisory Given   Pulmonary  breath sounds clear to auscultation        Cardiovascular Rhythm:Regular Rate:Normal     Neuro/Psych    GI/Hepatic   Endo/Other    Renal/GU      Musculoskeletal   Abdominal   Peds  Hematology  (+) Blood dyscrasia, Sickle cell trait , Sickle Cell Trait   Anesthesia Other Findings   Reproductive/Obstetrics                         Anesthesia Physical Anesthesia Plan  ASA: I  Anesthesia Plan: General   Post-op Pain Management:    Induction: Intravenous  Airway Management Planned: LMA  Additional Equipment:   Intra-op Plan:   Post-operative Plan: Extubation in OR  Informed Consent: I have reviewed the patients History and Physical, chart, labs and discussed the procedure including the risks, benefits and alternatives for the proposed anesthesia with the patient or authorized representative who has indicated his/her understanding and acceptance.   Dental advisory given  Plan Discussed with: Anesthesiologist  Anesthesia Plan Comments: (Recurrent L. EHL rupture  Plan GA with LMA  Kipp Brood, MD)       Anesthesia Quick Evaluation

## 2012-10-30 NOTE — Anesthesia Postprocedure Evaluation (Signed)
Anesthesia Post Note  Patient: Erik Diaz  Procedure(s) Performed: Procedure(s) (LRB): TENDON REPAIR- left great toe (Left)  Anesthesia type: General  Patient location: PACU  Post pain: Pain level controlled and Adequate analgesia  Post assessment: Post-op Vital signs reviewed, Patient's Cardiovascular Status Stable, Respiratory Function Stable, Patent Airway and Pain level controlled  Last Vitals:  Filed Vitals:   10/30/12 1232  BP: 114/49  Pulse: 56  Temp:   Resp: 9    Post vital signs: Reviewed and stable  Level of consciousness: awake, alert  and oriented  Complications: No apparent anesthesia complications

## 2012-10-30 NOTE — Op Note (Signed)
OPERATIVE REPORT  DATE OF SURGERY: 10/30/2012  PATIENT:  Erik Diaz,  42 y.o. male  PRE-OPERATIVE DIAGNOSIS:  Re-rupture Left Foot EHL  POST-OPERATIVE DIAGNOSIS:  Re-rupture Left Foot EHL  PROCEDURE:  Procedure(s): TENDON REPAIR- left great toe  SURGEON:  Surgeon(s): Nadara Mustard, MD  ANESTHESIA:   general  EBL:  min ML  SPECIMEN:  No Specimen  TOURNIQUET:  * No tourniquets in log *  PROCEDURE DETAILS: Patient is a 42 year old gentleman who presents followup status post EHL repair left foot. Patient rerupture of the repair 2 weeks postoperatively. Patient presents at this time for reconstruction. Risks and benefits were discussed including infection neurovascular injury nonhealing of the wound need for additional surgery. Patient states he understands was pursued this time. Description of procedure patient brought to the operating room and underwent a general anesthetic. After adequate levels of anesthesia were obtained patient's left lower extremity was prepped using DuraPrep and draped into a sterile field. The previous incision was used the edges of the EHL tendon were freshened using a 20 FiberWire suture this was woven in a Kessler suture technique. A total of 8 strands across the repair site with 2 Kessler sutures used. Patient had good extension of the toe. The wound was irrigated with normal saline. The skin was closing 2-0 nylon. The patient was placed in a posterior splint cast with the ankle and great toe dorsiflexed. Patient was extubated taken to the PACU in stable condition plan for discharge to home prescription for Vicodin for pain followup in the office in 2 weeks.  PLAN OF CARE: Discharge to home after PACU  PATIENT DISPOSITION:  PACU - hemodynamically stable.   Nadara Mustard, MD 10/30/2012 11:19 AM

## 2012-10-30 NOTE — Transfer of Care (Signed)
Immediate Anesthesia Transfer of Care Note  Patient: Erik Diaz  Procedure(s) Performed: Procedure(s) with comments: TENDON REPAIR- left great toe (Left) - Repair Left Foot Great Toe EHL (Extensor Hallucis Longus)  Patient Location: PACU  Anesthesia Type:General  Level of Consciousness: awake, alert  and oriented  Airway & Oxygen Therapy: Patient Spontanous Breathing and Patient connected to nasal cannula oxygen  Post-op Assessment: Report given to PACU RN  Post vital signs: Reviewed and stable  Complications: No apparent anesthesia complications

## 2012-10-30 NOTE — Preoperative (Signed)
Beta Blockers   Reason not to administer Beta Blockers:Not Applicable 

## 2012-10-30 NOTE — Anesthesia Postprocedure Evaluation (Signed)
  Anesthesia Post-op Note  Patient: Erik Diaz  Procedure(s) Performed: Procedure(s) with comments: TENDON REPAIR- left great toe (Left) - Repair Left Foot Great Toe EHL (Extensor Hallucis Longus)  Patient Location: PACU  Anesthesia Type:General  Level of Consciousness: awake, alert  and oriented  Airway and Oxygen Therapy: Patient Spontanous Breathing  Post-op Pain: none  Post-op Assessment: Post-op Vital signs reviewed, Patient's Cardiovascular Status Stable, Respiratory Function Stable, Patent Airway and Pain level controlled  Post-op Vital Signs: stable  Complications: No apparent anesthesia complications

## 2012-10-30 NOTE — Anesthesia Procedure Notes (Signed)
Procedure Name: LMA Insertion Date/Time: 10/30/2012 10:32 AM Performed by: Jefm Miles E Pre-anesthesia Checklist: Patient identified, Timeout performed, Emergency Drugs available, Suction available and Patient being monitored Patient Re-evaluated:Patient Re-evaluated prior to inductionOxygen Delivery Method: Circle system utilized Preoxygenation: Pre-oxygenation with 100% oxygen Intubation Type: IV induction LMA: LMA inserted LMA Size: 5.0 Number of attempts: 1 Placement Confirmation: positive ETCO2 and breath sounds checked- equal and bilateral Tube secured with: Tape Dental Injury: Teeth and Oropharynx as per pre-operative assessment

## 2012-11-01 ENCOUNTER — Encounter (HOSPITAL_COMMUNITY): Payer: Self-pay | Admitting: Orthopedic Surgery

## 2012-12-03 ENCOUNTER — Emergency Department (HOSPITAL_COMMUNITY): Payer: Federal, State, Local not specified - PPO

## 2012-12-03 ENCOUNTER — Encounter (HOSPITAL_COMMUNITY): Payer: Self-pay | Admitting: *Deleted

## 2012-12-03 ENCOUNTER — Emergency Department (HOSPITAL_COMMUNITY)
Admission: EM | Admit: 2012-12-03 | Discharge: 2012-12-03 | Disposition: A | Discharge status: Home / Self Care | Payer: Federal, State, Local not specified - PPO | Attending: Emergency Medicine | Admitting: Emergency Medicine

## 2012-12-03 DIAGNOSIS — R0600 Dyspnea, unspecified: Secondary | ICD-10-CM

## 2012-12-03 DIAGNOSIS — Z872 Personal history of diseases of the skin and subcutaneous tissue: Secondary | ICD-10-CM | POA: Insufficient documentation

## 2012-12-03 DIAGNOSIS — R0989 Other specified symptoms and signs involving the circulatory and respiratory systems: Secondary | ICD-10-CM | POA: Insufficient documentation

## 2012-12-03 DIAGNOSIS — R0609 Other forms of dyspnea: Secondary | ICD-10-CM | POA: Insufficient documentation

## 2012-12-03 DIAGNOSIS — Z79899 Other long term (current) drug therapy: Secondary | ICD-10-CM | POA: Insufficient documentation

## 2012-12-03 DIAGNOSIS — Z862 Personal history of diseases of the blood and blood-forming organs and certain disorders involving the immune mechanism: Secondary | ICD-10-CM | POA: Insufficient documentation

## 2012-12-03 LAB — BASIC METABOLIC PANEL
BUN: 14 mg/dL (ref 6–23)
Chloride: 100 mEq/L (ref 96–112)
GFR calc Af Amer: >90 mL/min (ref >90)
Potassium: 3.9 mEq/L (ref 3.5–5.1)

## 2012-12-03 LAB — CBC
HCT: 45 % (ref 39.0–52.0)
Hemoglobin: 16.6 g/dL (ref 13.0–17.0)
RDW: 13 % (ref 11.5–15.5)
WBC: 5.4 10*3/uL (ref 4.0–10.5)

## 2012-12-03 LAB — PRO B NATRIURETIC PEPTIDE: Pro B Natriuretic peptide (BNP): 8.4 pg/mL (ref 0–125)

## 2012-12-03 LAB — POCT I-STAT TROPONIN I

## 2012-12-03 MED ORDER — ALBUTEROL SULFATE HFA 108 (90 BASE) MCG/ACT IN AERS
2.0000 | INHALATION_SPRAY | RESPIRATORY_TRACT | Status: DC | PRN
  Administered 2012-12-03: 2 via RESPIRATORY_TRACT
  Filled 2012-12-03: qty 6.7

## 2012-12-03 MED ORDER — IPRATROPIUM BROMIDE 0.02 % IN SOLN
0.5000 mg | RESPIRATORY_TRACT | Status: DC
  Administered 2012-12-03 (×2): 0.5 mg via RESPIRATORY_TRACT
  Filled 2012-12-03 (×2): qty 2.5

## 2012-12-03 MED ORDER — ALBUTEROL SULFATE (5 MG/ML) 0.5% IN NEBU
2.5000 mg | INHALATION_SOLUTION | RESPIRATORY_TRACT | Status: DC
  Administered 2012-12-03 (×2): 2.5 mg via RESPIRATORY_TRACT
  Filled 2012-12-03 (×2): qty 0.5

## 2012-12-03 MED ORDER — IOHEXOL 350 MG/ML SOLN
100.0000 mL | Freq: Once | INTRAVENOUS | Status: AC | PRN
  Administered 2012-12-03: 100 mL via INTRAVENOUS

## 2012-12-03 MED ORDER — AEROCHAMBER PLUS FLO-VU LARGE MISC
1.0000 | Freq: Once | Status: DC
  Filled 2012-12-03: qty 1

## 2012-12-03 NOTE — ED Provider Notes (Signed)
I saw and evaluated the patient, reviewed the resident's note and I agree with the findings and plan.  And became hypoxic when ambulating and 88%.  He stated he fell extremity short of breath.  Given his recent immobilization the patient will undergo CT angiogram to evaluate for pulmonary embolism.  Care to Dr Rosalia Hammers to follow up on CT  Lyanne Co, MD 12/03/12 1627

## 2012-12-03 NOTE — ED Provider Notes (Addendum)
Erik Diaz is a 42 year old man who was signed out to me by Dr. Patria Mane. He has been having some dyspnea over the past several weeks. 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 He states he has had some similar episodes last year that were not diagnosed. He has not had any fever or, chills, or swelling in his legs. He T. angiogram was pending when Dr. Patria Mane left and I have reviewed it and it shows no evidence of any acute abnormality. I have spoken with the patient and reexamined him. He states that he feels somewhat improved after his second albuterol treatment. His lungs are clear to auscultation. I have ambulated the patient the length of the hallway and his oxygen saturations remained 99-100%. Given his response to the albuterol, he will be given an albuterol HA. He is instructed to return if he has any further problems.  Hilario Quarry, MD 12/03/12 1741   I performed a history and physical examination of Erik Diaz and discussed his management with Dr. Paulina Fusi.  I agree with the history, physical, assessment, and plan of care, with the following exceptions: None  I was present for the following procedures: None Time Spent in Critical Care of the patient: None Time spent in discussions with the patient and family: 10  Reviewed and agree with resident's ekg interpretation.   Holli Humbles, MD 12/30/12 [phone]

## 2012-12-03 NOTE — ED Notes (Signed)
Pt reports intermittent episodes of sob x 1 year. But having increase in sob x 2 days. Seems to get worse when lying down at night. Had surgery to left foot two months ago and has been lying in bed frequently. Got up yesterday and was lightheaded and almost passed out. Also has left side chest tightness. ekg done at triage, airway intact.

## 2012-12-03 NOTE — ED Provider Notes (Signed)
CSN: [id number]     Arrival date & time 12/03/12  1103 History   First MD Initiated Contact with Patient 12/03/12 1129     Chief Complaint  Patient presents with  . Shortness of Breath   HPI Pt is a 42 y/o M with PMHx of sickle cell trait who presents to the ED with shortness of breath that has been ongoing for the past year, recently worsened over the past few days.  Pt states he first started noticing shortness of breath, with supine position about one yr ago.  It would happen a couple times per month, progressing to a couple times per week, and now over the past two months has become almost daily.  Pt notices it when he is laying flat, doesn't happen at a specific time of the day, and does not notice any associated Sx.  Does not have hx of asthma or occupational exposure or home exposure.  He lives in an older house, has been for the past 7 yrs, and has not recently done construction on the house, moved, recent changes in soaps/detergents, or exposure to molds.  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  Denies any allergic symptoms including rhinorrhea, pruritis around his eyes, sneezing, cough, fever, chills.  As well, he denies any chest pain, nausea, vomiting, abdominal pain, radiating pain down either arm or into the jaw, diplopia, loss of vision, tinnitus, HA, dizziness, lightheadedness, lower extremity edema or pain.   Over the past yr, pt has started exercising, as he weighted 300+ lbs around 1.5 yrs ago.  He weighed around 245 lbs two months ago.  Denies any Dyspnea on exertion or chest pain when working out.  As well he denies any testosterone use, tobacco use currently or previous, new medications which he started over the past year, alcohol or illicit substance use.    However, pt did have ankle tendon surgery about 2 months ago and has been bed ridden since that time, only getting up for doctors appointments or to  run errands.  Denies any calf tenderness or calf pain.  Has not traveled any where recently, or recent sick contacts.   Past Medical History  Diagnosis Date  . Sickle cell trait   . Rosacea    Past Surgical History  Procedure Laterality Date  . Finger fracture surgery Right 1990  . Repair extensor tendon Left 10/18/2012    Procedure: Repair Left Foot Great Toe Extensor Tendon;  Surgeon: Nadara Mustard, MD;  Location: [facility] OR;  Service: Orthopedics;  Laterality: Left;  Repair Left Foot Great Toe Extensor Tendon  . Tendon repair Left 10/30/2012    Procedure: TENDON REPAIR- left great toe;  Surgeon: Nadara Mustard, MD;  Location: [facility] OR;  Service: Orthopedics;  Laterality: Left;  Repair Left Foot Great Toe EHL (Extensor Hallucis Longus)   Family History  Problem Relation Age of Onset  . High Cholesterol Father    History  Substance Use Topics  . Smoking status: Never Smoker   . Smokeless tobacco: Never Used  . Alcohol Use: Yes     Comment: occ    Review of Systems  Constitutional: Positive for activity change and unexpected weight change. Negative for fever, chills, appetite change and fatigue.  HENT: Negative.  Negative for sore throat and trouble swallowing.   Eyes: Negative for photophobia and pain.  Respiratory: Positive for shortness of breath. Negative for cough,  chest tightness and wheezing.   Cardiovascular: Negative for chest pain, palpitations and leg swelling.  Gastrointestinal: Negative for nausea, vomiting, abdominal pain and diarrhea.  Endocrine: Negative.   Genitourinary: Negative.  Negative for dysuria.  Skin: Negative.  Negative for rash and wound.  Neurological: Negative for dizziness, weakness and headaches.  Psychiatric/Behavioral: Negative.   All other systems reviewed and are negative.    Allergies  Antibacterial hand soap  Home Medications   Current Outpatient Rx  Name  Route  Sig  Dispense  Refill  . HYDROcodone-acetaminophen (NORCO) 5-325 MG per  tablet   Oral   Take 1 tablet by mouth every 6 (six) hours as needed for pain.   60 tablet   0   . metroNIDAZOLE (METROCREAM) 0.75 % cream   Topical   Apply 1 application topically daily as needed. Apply as needed for Rosacea         . Omega-3 Fatty Acids (FISH OIL PO)   Oral   Take 15 mLs by mouth daily. Takes about 1 tablespoon daily         . OVER THE COUNTER MEDICATION      200 mg. Tetris tribulus herbal supplement--testosterone booster for bodybuilding          BP 115/94  Pulse 60  Temp(Src) 98.2 F (36.8 C) (Oral)  Resp 16  Ht 6\' 2"  (1.88 m)  Wt 232 lb 3.2 oz (105.325 kg)  BMI 29.8 kg/m2  SpO2 88% Physical Exam  Nursing note and vitals reviewed. Constitutional: He is oriented to person, place, and time. He appears well-developed and well-nourished.  HENT:  Head: Normocephalic and atraumatic.  Eyes: Conjunctivae and EOM are normal.  Neck: Normal range of motion. Neck supple.  Cardiovascular: Normal rate, regular rhythm, normal heart sounds and intact distal pulses.  Exam reveals no gallop.   No murmur heard. Pulmonary/Chest: Breath sounds normal. No accessory muscle usage. Not tachypneic. No respiratory distress. He has no rales.  Abdominal: Normal appearance and bowel sounds are normal. There is no tenderness. There is no rebound, no guarding and no CVA tenderness.  Musculoskeletal:  L ankle in CAM walker Boot.  No calf tenderness B/L, no edema B/L, distal intact neurovascular B/L   Neurological: He is alert and oriented to person, place, and time.  Skin: Skin is warm, dry and intact.  Psychiatric: He has a normal mood and affect. His speech is normal.    ED Course  Procedures (including critical care time)  Date: 12/03/2012  Rate: 84  Rhythm: normal sinus rhythm  QRS Axis: normal  Intervals: normal  ST/T Wave abnormalities: normal  Conduction Disutrbances: none  Narrative Interpretation: Normal EKG   Old EKG Reviewed: No significant changes  noted  Labs Review Labs Reviewed  CBC - Abnormal; Notable for the following:    MCHC 36.9 (*)    Platelets 149 (*)    All other components within normal limits  BASIC METABOLIC PANEL  PRO B NATRIURETIC PEPTIDE  POCT I-STAT TROPONIN I   Imaging Review Dg Chest 2 View  12/03/2012   CLINICAL DATA:  Short of breath  EXAM: CHEST  2 VIEW  COMPARISON:  07/19/2012  FINDINGS: Cardiac and mediastinal contours are normal. Normal vascularity. Lungs are clear without infiltrate effusion or mass.  IMPRESSION: No active cardiopulmonary disease.   Electronically Signed   By: Marlan Palau M.D.   On: 12/03/2012 13:37    MDM   1. Dyspnea   Pt's SOB has multiple possibilities.  This could be related to a deconditioning, especially over the last two months.  He does not induce specific pleuritic chest pain or anything.  However, his Well's Score is 4.5 and he has been bed ridden after surgery for the past two months.  As well his lung exam does not have any rales/rhonchi/wheezing making CHF, hypersensitivity pneumonitis, asthma, or an extrinsic source causing his Sx.  Doubt cardiac origin as pt denise any radiating pain, no chest pain or pressure, and his Heart Score is 0, TIMI of 0.  Discussed case with Dr. Patria Mane, my attending, and we will get an ambulatory pulse ox and pending labs, may send a D-Dimer to evaluate for PE if his labs are normal and CXR does not show an infiltrate, overload, or lymphadenopathy.   1:55 PM - Pt vitals are completely normal w/o hypoxemia and his lab work has come back basically normal along with a normal CXR.  However, pt does become hypoxic with ambulation dropping down to 88-89%.  Due to concern for PE, will get CT angio of chest.  Will sign pt out to Dr. Rosalia Hammers for further f/u.    Twana First Paulina Fusi, DO of [facility] 12/03/2012, 3:35 PM    Briscoe Deutscher, DO 12/03/12 1535

## 2012-12-16 ENCOUNTER — Emergency Department (HOSPITAL_COMMUNITY)
Admission: EM | Admit: 2012-12-16 | Discharge: 2012-12-16 | Disposition: A | Discharge status: Home / Self Care | Payer: Federal, State, Local not specified - PPO | Source: Home / Self Care | Attending: Family Medicine | Admitting: Family Medicine

## 2012-12-16 ENCOUNTER — Encounter (HOSPITAL_COMMUNITY): Payer: Self-pay | Admitting: Emergency Medicine

## 2012-12-16 DIAGNOSIS — K600 Acute anal fissure: Secondary | ICD-10-CM

## 2012-12-16 DIAGNOSIS — K602 Anal fissure, unspecified: Secondary | ICD-10-CM

## 2012-12-16 MED ORDER — HYDROCORTISONE ACETATE 25 MG RE SUPP: 25.0000 mg | Freq: Two times a day (BID) | RECTAL | Status: AC

## 2012-12-16 NOTE — ED Notes (Signed)
C/o blood in stool. Noticed on Wednesday. State on tues and Wednesday had to strain to make bowel movement. Burning sensation around anus. Pt has used hemorrhoid cream with mild relief.  Burning sensation across stomach using prilosec otc with no relief.   Pt states that he had been bed written for the past two months due to having surgery on left foot x 2.

## 2012-12-16 NOTE — ED Provider Notes (Signed)
CSN: [id number]     Arrival date & time 12/16/12  1448 History   First MD Initiated Contact with Patient 12/16/12 1642     Chief Complaint  Patient presents with  . Rectal Bleeding    x 1 wk.   (Consider location/radiation/quality/duration/timing/severity/associated sxs/prior Treatment) Patient is a 42 y.o. male presenting with hematochezia. The history is provided by the patient.  Rectal Bleeding Quality:  Bright red Amount:  Moderate Duration:  6 days Timing:  Intermittent Progression:  Unchanged Chronicity:  New Context: constipation and defecation   Context: not hemorrhoids   Context comment:  Had hard bm last tues from bedrest s/p foot surg, and thereafter noticed brbpr and rectal burning, sx continue with bm. Similar prior episodes: no   Relieved by:  Hemorrhoid cream Associated symptoms: no abdominal pain, no dizziness, no epistaxis, no fever and no vomiting     Past Medical History  Diagnosis Date  . Sickle cell trait   . Rosacea    Past Surgical History  Procedure Laterality Date  . Finger fracture surgery Right 1990  . Repair extensor tendon Left 10/18/2012    Procedure: Repair Left Foot Great Toe Extensor Tendon;  Surgeon: Nadara Mustard, MD;  Location: [facility] OR;  Service: Orthopedics;  Laterality: Left;  Repair Left Foot Great Toe Extensor Tendon  . Tendon repair Left 10/30/2012    Procedure: TENDON REPAIR- left great toe;  Surgeon: Nadara Mustard, MD;  Location: [facility] OR;  Service: Orthopedics;  Laterality: Left;  Repair Left Foot Great Toe EHL (Extensor Hallucis Longus)   Family History  Problem Relation Age of Onset  . High Cholesterol Father    History  Substance Use Topics  . Smoking status: Never Smoker   . Smokeless tobacco: Never Used  . Alcohol Use: Yes     Comment: occ    Review of Systems  Constitutional: Negative for fever.  HENT: Negative for nosebleeds.   Gastrointestinal: Positive for constipation, hematochezia and rectal pain. Negative for  nausea, vomiting and abdominal pain.  Neurological: Negative for dizziness.    Allergies  Antibacterial hand soap  Home Medications   Current Outpatient Rx  Name  Route  Sig  Dispense  Refill  . Omeprazole (PRILOSEC PO)   Oral   Take by mouth.         Marland Kitchen HYDROcodone-acetaminophen (NORCO) 5-325 MG per tablet   Oral   Take 1 tablet by mouth every 6 (six) hours as needed for pain.   60 tablet   0   . hydrocortisone (ANUSOL-HC) 25 MG suppository   Rectal   Place 1 suppository (25 mg total) rectally 2 (two) times daily.   12 suppository   0   . metroNIDAZOLE (METROCREAM) 0.75 % cream   Topical   Apply 1 application topically daily as needed. Apply as needed for Rosacea         . Omega-3 Fatty Acids (FISH OIL PO)   Oral   Take 15 mLs by mouth daily. Takes about 1 tablespoon daily         . OVER THE COUNTER MEDICATION      200 mg. Tetris tribulus herbal supplement--testosterone booster for bodybuilding          BP 115/43  Pulse 74  Temp(Src) 98.3 F (36.8 C) (Oral)  Resp 14  SpO2 100% Physical Exam  Nursing note and vitals reviewed. Constitutional: He is oriented to person, place, and time. He appears well-developed and well-nourished.  Abdominal: Soft. Bowel  sounds are normal. He exhibits no distension and no mass. There is no tenderness. There is no rebound and no guarding.  Genitourinary: Prostate normal. Rectal exam shows fissure and tenderness. Rectal exam shows no external hemorrhoid, no internal hemorrhoid and no mass. Guaiac positive stool.     Musculoskeletal: Normal range of motion.  Neurological: He is alert and oriented to person, place, and time.  Skin: Skin is warm and dry.    ED Course  Procedures (including critical care time) Labs Review Labs Reviewed - No data to display Imaging Review No results found.    MDM      Linna Hoff, MD 12/16/12 [id number]

## 2014-03-02 IMAGING — CR DG FOOT COMPLETE 3+V*L*
3 series · 3 of 3 positions shown · non-contrast
Comparison: No priors.

CLINICAL DATA: Foot injury.

LEFT FOOT - COMPLETE 3+ VIEW

[x foot ap left]
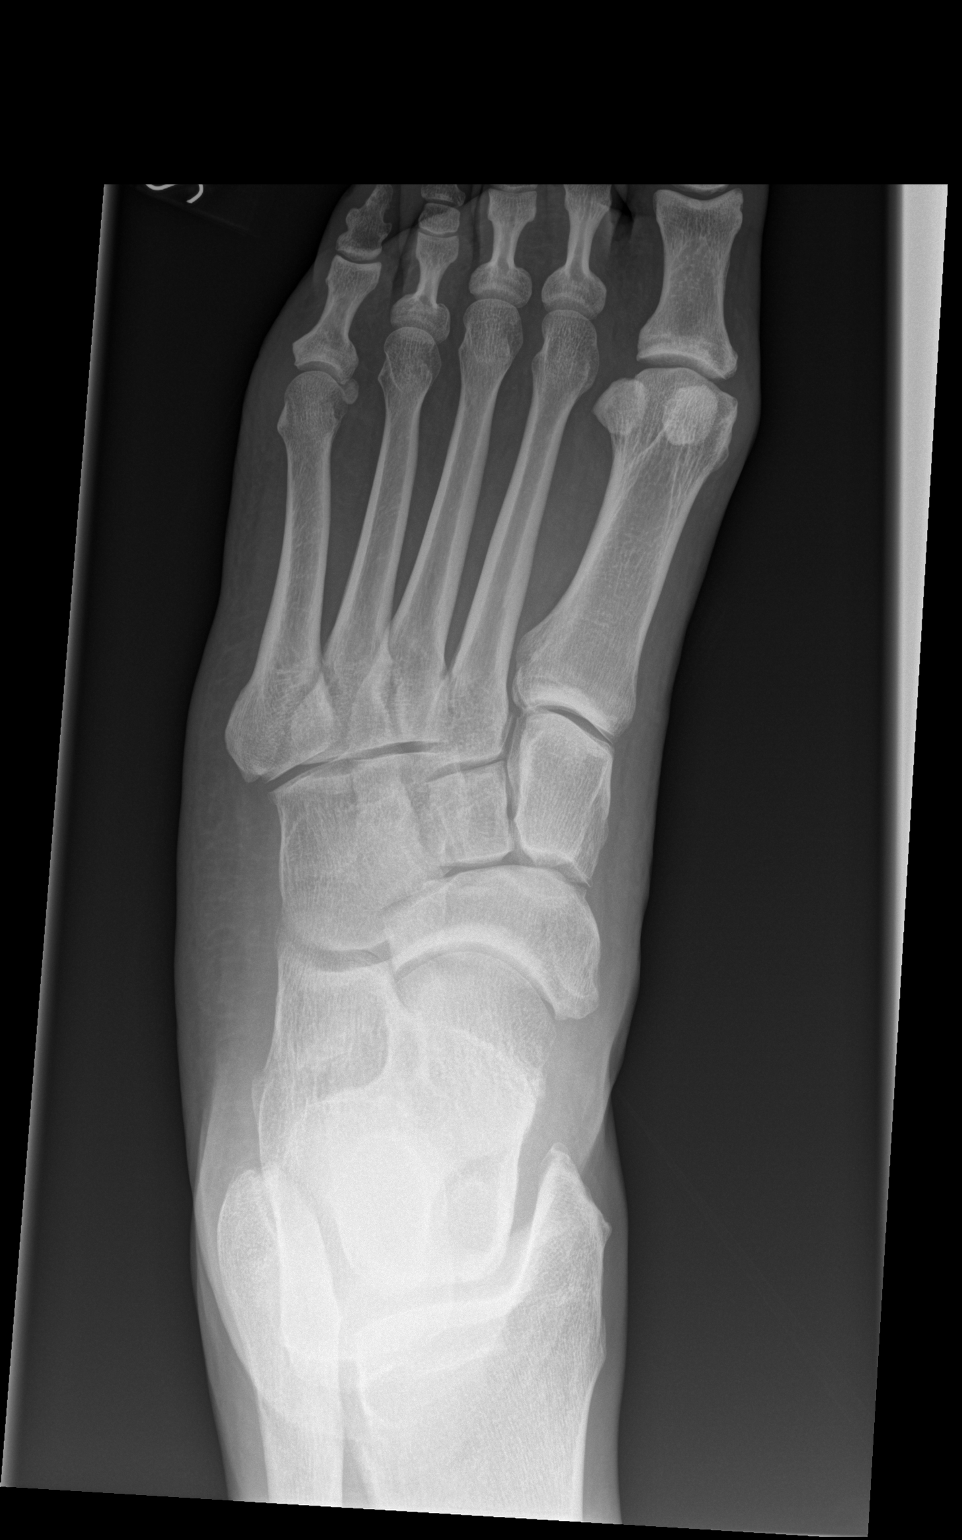

[x foot obl left]
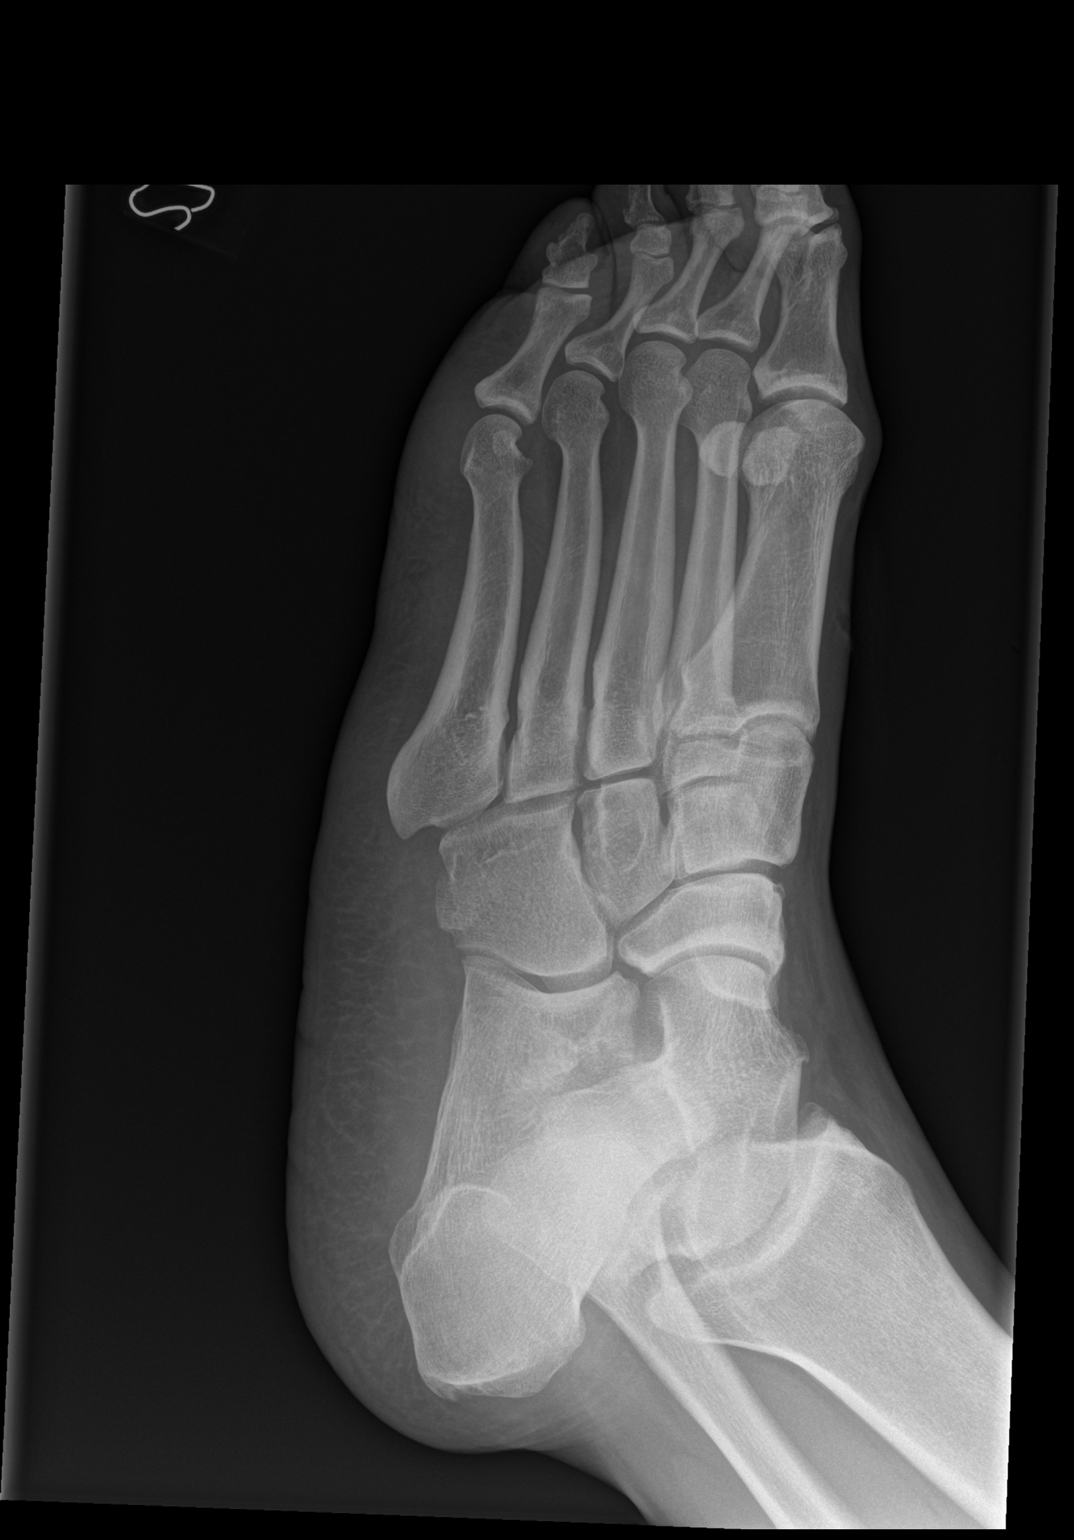

[x foot lat left]
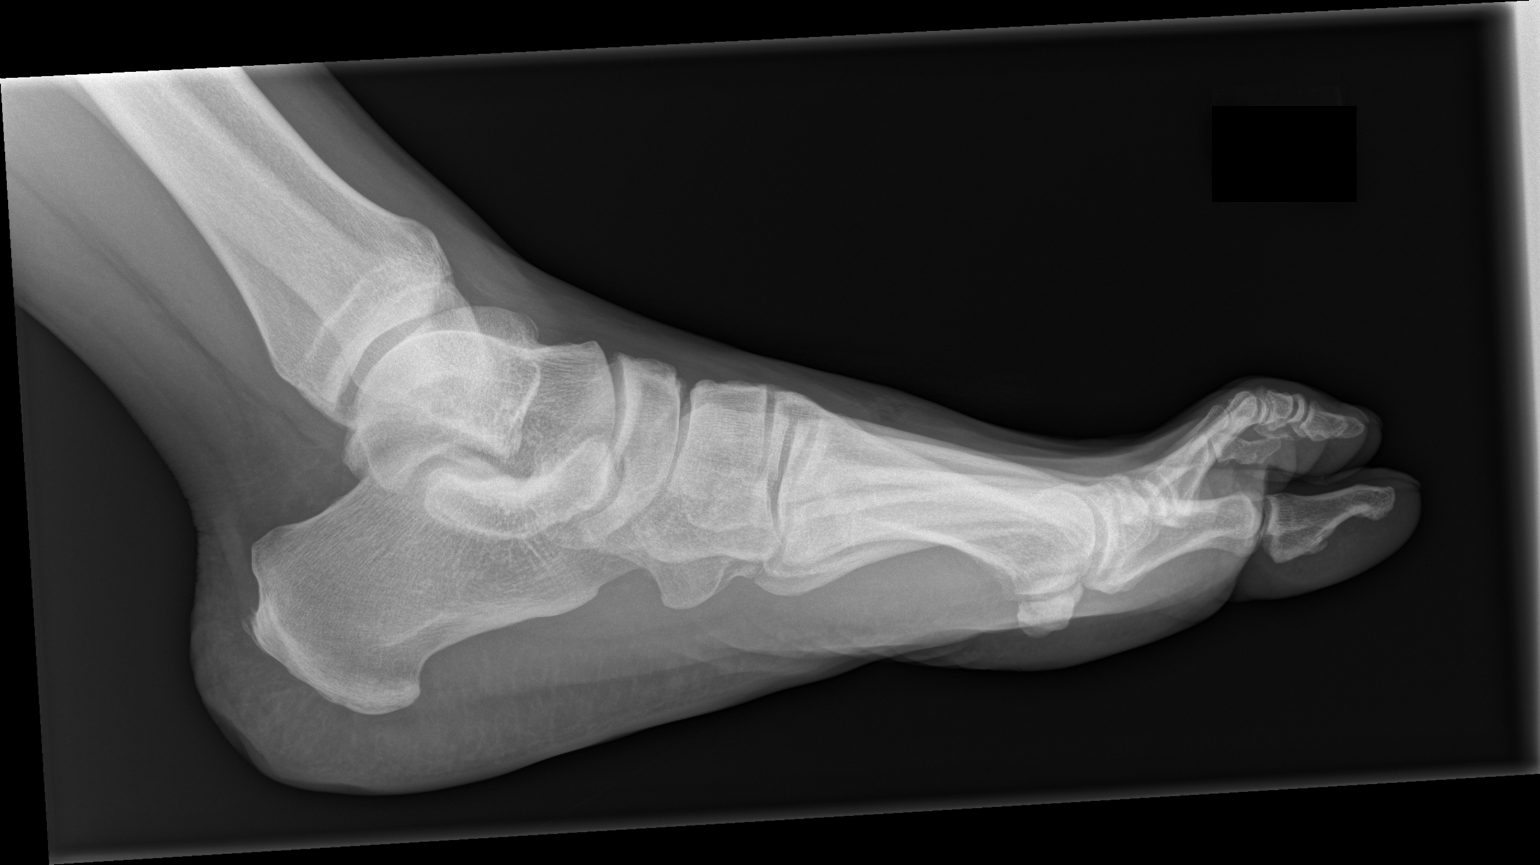

[3 of 3 positions shown; findings below may reference images not displayed]

FINDINGS: Three views of the left foot demonstrate no acute
displaced fracture, subluxation, dislocation, joint or soft tissue
abnormality.
IMPRESSION: 1.  No acute radiographic abnormality of the left foot.

## 2014-10-03 ENCOUNTER — Encounter (HOSPITAL_BASED_OUTPATIENT_CLINIC_OR_DEPARTMENT_OTHER): Payer: Self-pay

## 2014-10-03 ENCOUNTER — Emergency Department (HOSPITAL_BASED_OUTPATIENT_CLINIC_OR_DEPARTMENT_OTHER)
Admission: EM | Admit: 2014-10-03 | Discharge: 2014-10-03 | Disposition: A | Discharge status: Home / Self Care | Payer: Federal, State, Local not specified - PPO | Attending: Emergency Medicine | Admitting: Emergency Medicine

## 2014-10-03 DIAGNOSIS — Z79899 Other long term (current) drug therapy: Secondary | ICD-10-CM | POA: Diagnosis not present

## 2014-10-03 DIAGNOSIS — X58XXXA Exposure to other specified factors, initial encounter: Secondary | ICD-10-CM | POA: Diagnosis not present

## 2014-10-03 DIAGNOSIS — T162XXA Foreign body in left ear, initial encounter: Secondary | ICD-10-CM | POA: Insufficient documentation

## 2014-10-03 DIAGNOSIS — Y9389 Activity, other specified: Secondary | ICD-10-CM | POA: Insufficient documentation

## 2014-10-03 DIAGNOSIS — Z872 Personal history of diseases of the skin and subcutaneous tissue: Secondary | ICD-10-CM | POA: Diagnosis not present

## 2014-10-03 DIAGNOSIS — Z862 Personal history of diseases of the blood and blood-forming organs and certain disorders involving the immune mechanism: Secondary | ICD-10-CM | POA: Insufficient documentation

## 2014-10-03 DIAGNOSIS — S00452A Superficial foreign body of left ear, initial encounter: Secondary | ICD-10-CM

## 2014-10-03 DIAGNOSIS — Y998 Other external cause status: Secondary | ICD-10-CM | POA: Diagnosis not present

## 2014-10-03 DIAGNOSIS — Y929 Unspecified place or not applicable: Secondary | ICD-10-CM | POA: Insufficient documentation

## 2014-10-03 DIAGNOSIS — Z7952 Long term (current) use of systemic steroids: Secondary | ICD-10-CM | POA: Diagnosis not present

## 2014-10-03 NOTE — ED Notes (Signed)
Pt reports bilateral ear pain/pressure - concerned for Q-Tip being lodged in his left ear canal, after it broke off this morning.

## 2014-10-03 NOTE — ED Provider Notes (Signed)
CSN: [id number]     Arrival date & time 10/03/14  1627 History   First MD Initiated Contact with Patient 10/03/14 1640     Chief Complaint  Patient presents with  . Foreign Body in Ear     (Consider location/radiation/quality/duration/timing/severity/associated sxs/prior Treatment) Patient is a 44 y.o. male presenting with foreign body in ear. The history is provided by the patient. No language interpreter was used.  Foreign Body in Ear This is a new problem. The current episode started today. The problem occurs constantly. The problem has been gradually worsening. Pertinent negatives include no fever. Nothing aggravates the symptoms. He has tried nothing for the symptoms.   Pt reports a qtip broke off in his ear. Past Medical History  Diagnosis Date  . Sickle cell trait   . Rosacea    Past Surgical History  Procedure Laterality Date  . Finger fracture surgery Right 1990  . Repair extensor tendon Left 10/18/2012    Procedure: Repair Left Foot Great Toe Extensor Tendon;  Surgeon: Nadara Mustard, MD;  Location: [facility] OR;  Service: Orthopedics;  Laterality: Left;  Repair Left Foot Great Toe Extensor Tendon  . Tendon repair Left 10/30/2012    Procedure: TENDON REPAIR- left great toe;  Surgeon: Nadara Mustard, MD;  Location: [facility] OR;  Service: Orthopedics;  Laterality: Left;  Repair Left Foot Great Toe EHL (Extensor Hallucis Longus)   Family History  Problem Relation Age of Onset  . High Cholesterol Father    History  Substance Use Topics  . Smoking status: Never Smoker   . Smokeless tobacco: Never Used  . Alcohol Use: Yes     Comment: occ    Review of Systems  Constitutional: Negative for fever.  All other systems reviewed and are negative.     Allergies  Antibacterial hand soap  Home Medications   Prior to Admission medications   Medication Sig Start Date End Date Taking? Authorizing Provider  HYDROcodone-acetaminophen (NORCO) 5-325 MG per tablet Take 1 tablet by mouth every  6 (six) hours as needed for pain. 10/30/12   Nadara Mustard, MD  hydrocortisone (ANUSOL-HC) 25 MG suppository Place 1 suppository (25 mg total) rectally 2 (two) times daily. 12/16/12   Linna Hoff, MD  metroNIDAZOLE (METROCREAM) 0.75 % cream Apply 1 application topically daily as needed. Apply as needed for Rosacea    Historical Provider, MD  Omega-3 Fatty Acids (FISH OIL PO) Take 15 mLs by mouth daily. Takes about 1 tablespoon daily    Historical Provider, MD  Omeprazole (PRILOSEC PO) Take by mouth.    Historical Provider, MD  OVER THE COUNTER MEDICATION 200 mg. Tetris tribulus herbal supplement--testosterone booster for bodybuilding    Historical Provider, MD   BP 142/54 mmHg  Pulse 70  Temp(Src) 98.4 F (36.9 C) (Oral)  Resp 16  Ht 6\' 2"  (1.88 m)  Wt 260 lb (117.935 kg)  BMI 33.37 kg/m2  SpO2 97% Physical Exam  Constitutional: He appears well-developed and well-nourished.  HENT:  Head: Normocephalic and atraumatic.  Right Ear: External ear normal.  Tm clear,   I initially did not see foreign body, RN pointed out  Musculoskeletal: Normal range of motion.  Neurological: He is alert.  Skin: Skin is warm.  Psychiatric: He has a normal mood and affect.  Nursing note and vitals reviewed.   ED Course  Procedures (including critical care time)  Tiny piece of cotton lower ear canal, removed easily Labs Review Labs Reviewed - No data to  display  Imaging Review No results found.   EKG Interpretation None      MDM   Final diagnoses:  Foreign body in ear lobe, left, initial encounter    avs    Elson Areas, PA-C 10/03/14 1736  Glynn Octave, MD 10/03/14 1758

## 2014-10-03 NOTE — Discharge Instructions (Signed)
Ear Foreign Body °An ear foreign body is an object that is stuck in the ear. It is common for young children to put objects into the ear canal. These may include pebbles, beads, beans, and any other small objects which will fit. In adults, objects such as cotton swabs may become lodged in the ear canal. In all ages, the most common foreign bodies are insects that enter the ear canal.  °SYMPTOMS  °Foreign bodies may cause pain, buzzing or roaring sounds, hearing loss, and ear drainage.  °HOME CARE INSTRUCTIONS  °· Keep all follow-up appointments with your caregiver as told. °· Keep small objects out of reach of young children. Tell them not to put anything in their ears. °SEEK IMMEDIATE MEDICAL CARE IF:  °· You have bleeding from the ear. °· You have increased pain or swelling of the ear. °· You have reduced hearing. °· You have discharge coming from the ear. °· You have a fever. °· You have a headache. °MAKE SURE YOU:  °· Understand these instructions. °· Will watch your condition. °· Will get help right away if you are not doing well or get worse. °Document Released: 02/18/2000 Document Revised: 05/15/2011 Document Reviewed: 10/09/2007 °[employer]® Patient Information ©2015 [employer]. This information is not intended to replace advice given to you by your health care provider. Make sure you discuss any questions you have with your health care provider. ° °

## 2015-10-14 ENCOUNTER — Encounter (HOSPITAL_COMMUNITY): Payer: Self-pay

## 2015-10-14 ENCOUNTER — Emergency Department (HOSPITAL_COMMUNITY)
Admission: EM | Admit: 2015-10-14 | Discharge: 2015-10-14 | Disposition: A | Discharge status: Home / Self Care | Payer: Federal, State, Local not specified - PPO | Attending: Emergency Medicine | Admitting: Emergency Medicine

## 2015-10-14 DIAGNOSIS — M7631 Iliotibial band syndrome, right leg: Secondary | ICD-10-CM | POA: Diagnosis not present

## 2015-10-14 DIAGNOSIS — M7061 Trochanteric bursitis, right hip: Secondary | ICD-10-CM | POA: Insufficient documentation

## 2015-10-14 DIAGNOSIS — M25551 Pain in right hip: Secondary | ICD-10-CM | POA: Diagnosis present

## 2015-10-14 DIAGNOSIS — Y93B9 Activity, other involving muscle strengthening exercises: Secondary | ICD-10-CM | POA: Insufficient documentation

## 2015-10-14 MED ORDER — MELOXICAM 15 MG PO TABS: 15.0000 mg | ORAL_TABLET | Freq: Every day | ORAL | 0 refills | Status: AC

## 2015-10-14 NOTE — ED Notes (Signed)
Declined W/C at D/C and was escorted to lobby by RN. 

## 2015-10-14 NOTE — Discharge Instructions (Signed)
Take mobic daily as directed for inflammation and pain with tylenol for breakthrough pain. Use heat to areas of soreness, no more than 20 minutes at a time every hour. Follow up with the orthopedist for recheck of ongoing symptoms in the next 1-2 weeks. Return to ER for emergent changing or worsening of symptoms.   You may want to consider using ARNICA to help with symptoms.

## 2015-10-14 NOTE — ED Triage Notes (Signed)
Patient here with right hip pain that started during the night after turning over in bed. States that the pain radiates down side of leg and back of leg, denies trauma. Pain intensifies with position changes. NAD

## 2015-10-14 NOTE — ED Provider Notes (Signed)
[facility]-EMERGENCY DEPT Provider Note   CSN: [id number] Arrival date & time: 10/14/15  [id number]  First Provider Contact:  First MD Initiated Contact with Patient 10/14/15 [phone]        History   Chief Complaint No chief complaint on file.   HPI Erik Diaz is a 45 y.o. male with a PMHx of sickle cell trait and rosacea, who presents to the ED with complaints of right hip pain that began this morning when he awoke. He states that approximately 1 month ago he hurt himself working out, at that time he hurt both hips, went to his primary care doctor and was told he had mild arthritis. Last night he slept on his left side, and when he awoke this morning he attempted to lift his right leg and felt pain in the greater trochanteric region radiating down the IT band. He describes the pain is 9/10 intermittent aching and burning pain in the greater trochanteric bursa region radiating down the lateral thigh and into the knee, worse with standing, and mildly improved with ibuprofen 600 mg taken this morning. He denies any recent trauma or injury aside from last month when he worked out. He denies any fevers, chills, chest pain, shortness breath, abdominal pain, nausea vomiting, diarrhea, constipation, dysuria, hematuria, testicular pain or swelling, penile discharge, back pain, numbness, tingling, or focal weakness.   The history is provided by the patient and medical records. No language interpreter was used.    Past Medical History:  Diagnosis Date  . Rosacea   . Sickle cell trait (HCC)     There are no active problems to display for this patient.   Past Surgical History:  Procedure Laterality Date  . FINGER FRACTURE SURGERY Right 1990  . REPAIR EXTENSOR TENDON Left 10/18/2012   Procedure: Repair Left Foot Great Toe Extensor Tendon;  Surgeon: Nadara MustardMarcus V Duda, MD;  Location: [facility] OR;  Service: Orthopedics;  Laterality: Left;  Repair Left Foot Great Toe Extensor Tendon  . TENDON REPAIR Left 10/30/2012     Procedure: TENDON REPAIR- left great toe;  Surgeon: Nadara MustardMarcus V Duda, MD;  Location: [facility] OR;  Service: Orthopedics;  Laterality: Left;  Repair Left Foot Great Toe EHL (Extensor Hallucis Longus)       Home Medications    Prior to Admission medications   Medication Sig Start Date End Date Taking? Authorizing Provider  HYDROcodone-acetaminophen (NORCO) 5-325 MG per tablet Take 1 tablet by mouth every 6 (six) hours as needed for pain. 10/30/12   Nadara MustardMarcus V Duda, MD  hydrocortisone (ANUSOL-HC) 25 MG suppository Place 1 suppository (25 mg total) rectally 2 (two) times daily. 12/16/12   Linna HoffJames D Kindl, MD  metroNIDAZOLE (METROCREAM) 0.75 % cream Apply 1 application topically daily as needed. Apply as needed for Rosacea    Historical Provider, MD  Omega-3 Fatty Acids (FISH OIL PO) Take 15 mLs by mouth daily. Takes about 1 tablespoon daily    Historical Provider, MD  Omeprazole (PRILOSEC PO) Take by mouth.    Historical Provider, MD  OVER THE COUNTER MEDICATION 200 mg. Tetris tribulus herbal supplement--testosterone booster for bodybuilding    Historical Provider, MD    Family History Family History  Problem Relation Age of Onset  . High Cholesterol Father     Social History Social History  Substance Use Topics  . Smoking status: Never Smoker  . Smokeless tobacco: Never Used  . Alcohol use Yes     Comment: occ     Allergies  Antibacterial hand soap [triclosan]   Review of Systems Review of Systems  Constitutional: Negative for chills and fever.  Respiratory: Negative for shortness of breath.   Cardiovascular: Negative for chest pain.  Gastrointestinal: Negative for abdominal pain, constipation, diarrhea, nausea and vomiting.  Genitourinary: Negative for discharge, dysuria, hematuria, scrotal swelling and testicular pain.  Musculoskeletal: Positive for arthralgias and myalgias. Negative for back pain and joint swelling.  Skin: Negative for color change.  Allergic/Immunologic:  Negative for immunocompromised state.  Neurological: Negative for weakness and numbness.  Psychiatric/Behavioral: Negative for confusion.   10 Systems reviewed and are negative for acute change except as noted in the HPI.   Physical Exam Updated Vital Signs BP 151/71 (BP Location: Right Arm)   Pulse 60   Temp 98.7 F (37.1 C)   Resp 20   SpO2 99%   Physical Exam  Constitutional: He is oriented to person, place, and time. Vital signs are normal. He appears well-developed and well-nourished.  Non-toxic appearance. No distress.  Afebrile, nontoxic, NAD  HENT:  Head: Normocephalic and atraumatic.  Mouth/Throat: Mucous membranes are normal.  Eyes: Conjunctivae and EOM are normal. Right eye exhibits no discharge. Left eye exhibits no discharge.  Neck: Normal range of motion. Neck supple.  Cardiovascular: Normal rate and intact distal pulses.   Pulmonary/Chest: Effort normal. No respiratory distress.  Abdominal: Normal appearance. He exhibits no distension.  Musculoskeletal: Normal range of motion.       Right hip: He exhibits tenderness. He exhibits normal range of motion, normal strength, no swelling, no crepitus and no deformity.       Legs: R hip with FROM intact, with no focal bony or joint line TTP, mildly TTP along the greater trochanteric bursa and into the IT band which is tight, no bruising or swelling, no crepitus or deformity, no limb length discrepancy or abnormal rotation. No pain with log roll testing. Strength and sensation grossly intact, distal pulses intact, compartments soft.   Neurological: He is alert and oriented to person, place, and time. He has normal strength. No sensory deficit.  Skin: Skin is warm, dry and intact. No rash noted.  Psychiatric: He has a normal mood and affect.  Nursing note and vitals reviewed.    ED Treatments / Results  Labs (all labs ordered are listed, but only abnormal results are displayed) Labs Reviewed - No data to display  EKG   EKG Interpretation None       Radiology No results found.  Procedures Procedures (including critical care time)  Medications Ordered in ED Medications - No data to display   Initial Impression / Assessment and Plan / ED Course  I have reviewed the triage vital signs and the nursing notes.  Pertinent labs & imaging results that were available during my care of the patient were reviewed by me and considered in my medical decision making (see chart for details).  Clinical Course    45 y.o. male here with 1 day of R hip pain, atraumatic. Had injury working out last month, but seemed to improve, and now worsened. NVI with soft compartments, FROM intact, ambulatory without difficulty. Tenderness over the greater trochanter and into the IT band which is tight. Discussed that it's likely trochanteric bursitis/IT band syndrome, discussed use of heat, start mobic daily, tylenol PRN, and see ortho in 1-2wks for recheck of ongoing symptoms. Doubt need for imaging. I explained the diagnosis and have given explicit precautions to return to the ER including for any other new  or worsening symptoms. The patient understands and accepts the medical plan as it's been dictated and I have answered their questions. Discharge instructions concerning home care and prescriptions have been given. The patient is STABLE and is discharged to home in good condition.   Final Clinical Impressions(s) / ED Diagnoses   Final diagnoses:  Greater trochanteric bursitis of right hip  IT band syndrome, right    New Prescriptions New Prescriptions   MELOXICAM (MOBIC) 15 MG TABLET    Take 1 tablet (15 mg total) by mouth daily. TAKE WITH MEALS     Shaili Donalson Camprubi-Soms, PA-C 10/14/15 0827    Maia Plan, MD 10/14/15 [phone]

## 2019-03-19 ENCOUNTER — Ambulatory Visit: Payer: Federal, State, Local not specified - PPO | Attending: Internal Medicine

## 2019-03-19 DIAGNOSIS — Z20822 Contact with and (suspected) exposure to covid-19: Secondary | ICD-10-CM

## 2019-03-20 LAB — NOVEL CORONAVIRUS, NAA: SARS-CoV-2, NAA: NOT DETECTED

## 2019-03-28 ENCOUNTER — Ambulatory Visit: Payer: Federal, State, Local not specified - PPO | Attending: Internal Medicine

## 2019-03-28 DIAGNOSIS — Z20822 Contact with and (suspected) exposure to covid-19: Secondary | ICD-10-CM

## 2019-03-29 LAB — NOVEL CORONAVIRUS, NAA: SARS-CoV-2, NAA: NOT DETECTED

## 2020-01-28 ENCOUNTER — Other Ambulatory Visit: Payer: Self-pay | Admitting: Internal Medicine

## 2020-01-28 DIAGNOSIS — N62 Hypertrophy of breast: Secondary | ICD-10-CM

## 2021-03-01 ENCOUNTER — Other Ambulatory Visit: Payer: Self-pay | Admitting: Internal Medicine

## 2021-03-11 ENCOUNTER — Other Ambulatory Visit: Payer: Self-pay | Admitting: Internal Medicine

## 2021-03-11 ENCOUNTER — Other Ambulatory Visit (HOSPITAL_COMMUNITY): Payer: Self-pay | Admitting: Internal Medicine

## 2021-03-11 DIAGNOSIS — N50812 Left testicular pain: Secondary | ICD-10-CM

## 2021-03-21 ENCOUNTER — Ambulatory Visit (HOSPITAL_COMMUNITY)
Admission: RE | Admit: 2021-03-21 | Discharge: 2021-03-21 | Disposition: A | Discharge status: Home / Self Care | Payer: No Typology Code available for payment source | Source: Ambulatory Visit | Attending: Internal Medicine | Admitting: Internal Medicine

## 2021-03-21 ENCOUNTER — Other Ambulatory Visit: Payer: Self-pay

## 2021-03-21 DIAGNOSIS — N50812 Left testicular pain: Secondary | ICD-10-CM | POA: Diagnosis not present

## 2021-05-18 DIAGNOSIS — F411 Generalized anxiety disorder: Secondary | ICD-10-CM | POA: Diagnosis not present

## 2021-05-18 DIAGNOSIS — M25512 Pain in left shoulder: Secondary | ICD-10-CM | POA: Diagnosis not present

## 2021-05-18 DIAGNOSIS — F4312 Post-traumatic stress disorder, chronic: Secondary | ICD-10-CM | POA: Diagnosis not present

## 2021-05-25 DIAGNOSIS — M25512 Pain in left shoulder: Secondary | ICD-10-CM | POA: Diagnosis not present

## 2021-05-27 DIAGNOSIS — Z09 Encounter for follow-up examination after completed treatment for conditions other than malignant neoplasm: Secondary | ICD-10-CM | POA: Diagnosis not present

## 2021-05-27 DIAGNOSIS — K6289 Other specified diseases of anus and rectum: Secondary | ICD-10-CM | POA: Diagnosis not present

## 2021-05-27 DIAGNOSIS — Z86010 Personal history of colonic polyps: Secondary | ICD-10-CM | POA: Diagnosis not present

## 2021-06-23 DIAGNOSIS — Z86010 Personal history of colonic polyps: Secondary | ICD-10-CM | POA: Diagnosis not present

## 2021-06-23 DIAGNOSIS — Z1211 Encounter for screening for malignant neoplasm of colon: Secondary | ICD-10-CM | POA: Diagnosis not present

## 2021-06-23 DIAGNOSIS — K635 Polyp of colon: Secondary | ICD-10-CM | POA: Diagnosis not present

## 2021-09-19 DIAGNOSIS — M25462 Effusion, left knee: Secondary | ICD-10-CM | POA: Diagnosis not present

## 2021-10-04 ENCOUNTER — Other Ambulatory Visit (HOSPITAL_COMMUNITY): Payer: Self-pay | Admitting: Internal Medicine

## 2021-10-04 ENCOUNTER — Other Ambulatory Visit: Payer: Self-pay | Admitting: Internal Medicine

## 2021-10-24 ENCOUNTER — Other Ambulatory Visit: Payer: Self-pay | Admitting: Internal Medicine

## 2021-10-24 ENCOUNTER — Other Ambulatory Visit (HOSPITAL_COMMUNITY): Payer: Self-pay | Admitting: Internal Medicine

## 2021-10-24 DIAGNOSIS — M25562 Pain in left knee: Secondary | ICD-10-CM

## 2021-11-02 ENCOUNTER — Ambulatory Visit (HOSPITAL_COMMUNITY)
Admission: RE | Admit: 2021-11-02 | Discharge: 2021-11-02 | Disposition: A | Discharge status: Home / Self Care | Payer: No Typology Code available for payment source | Source: Ambulatory Visit | Attending: Internal Medicine | Admitting: Internal Medicine

## 2021-11-02 DIAGNOSIS — M25562 Pain in left knee: Secondary | ICD-10-CM | POA: Diagnosis present

## 2021-11-28 DIAGNOSIS — M25562 Pain in left knee: Secondary | ICD-10-CM | POA: Diagnosis not present

## 2021-11-28 DIAGNOSIS — S8982XD Other specified injuries of left lower leg, subsequent encounter: Secondary | ICD-10-CM | POA: Diagnosis not present

## 2021-12-05 DIAGNOSIS — M25561 Pain in right knee: Secondary | ICD-10-CM | POA: Diagnosis not present

## 2021-12-05 DIAGNOSIS — X58XXXA Exposure to other specified factors, initial encounter: Secondary | ICD-10-CM | POA: Diagnosis not present

## 2021-12-05 DIAGNOSIS — S83242A Other tear of medial meniscus, current injury, left knee, initial encounter: Secondary | ICD-10-CM | POA: Diagnosis not present

## 2021-12-27 DIAGNOSIS — M23222 Derangement of posterior horn of medial meniscus due to old tear or injury, left knee: Secondary | ICD-10-CM | POA: Diagnosis not present

## 2022-01-06 DIAGNOSIS — I10 Essential (primary) hypertension: Secondary | ICD-10-CM | POA: Diagnosis not present

## 2022-01-06 DIAGNOSIS — Z125 Encounter for screening for malignant neoplasm of prostate: Secondary | ICD-10-CM | POA: Diagnosis not present

## 2022-02-03 DIAGNOSIS — I10 Essential (primary) hypertension: Secondary | ICD-10-CM | POA: Diagnosis not present

## 2022-02-03 DIAGNOSIS — M23022 Cystic meniscus, posterior horn of medial meniscus, left knee: Secondary | ICD-10-CM | POA: Diagnosis not present

## 2022-02-03 DIAGNOSIS — Z01818 Encounter for other preprocedural examination: Secondary | ICD-10-CM | POA: Diagnosis not present

## 2022-02-03 DIAGNOSIS — G4733 Obstructive sleep apnea (adult) (pediatric): Secondary | ICD-10-CM | POA: Diagnosis not present

## 2022-02-10 DIAGNOSIS — M659 Synovitis and tenosynovitis, unspecified: Secondary | ICD-10-CM | POA: Diagnosis not present

## 2022-02-10 DIAGNOSIS — M94262 Chondromalacia, left knee: Secondary | ICD-10-CM | POA: Diagnosis not present

## 2022-02-13 DIAGNOSIS — M659 Synovitis and tenosynovitis, unspecified: Secondary | ICD-10-CM | POA: Diagnosis not present

## 2022-02-13 DIAGNOSIS — M94262 Chondromalacia, left knee: Secondary | ICD-10-CM | POA: Diagnosis not present

## 2022-03-28 DIAGNOSIS — Z9889 Other specified postprocedural states: Secondary | ICD-10-CM | POA: Diagnosis not present

## 2022-03-28 DIAGNOSIS — M25562 Pain in left knee: Secondary | ICD-10-CM | POA: Diagnosis not present

## 2022-03-28 DIAGNOSIS — M25462 Effusion, left knee: Secondary | ICD-10-CM | POA: Diagnosis not present

## 2022-05-01 DIAGNOSIS — M25562 Pain in left knee: Secondary | ICD-10-CM | POA: Diagnosis not present

## 2022-05-05 DIAGNOSIS — M25562 Pain in left knee: Secondary | ICD-10-CM | POA: Diagnosis not present

## 2022-05-11 DIAGNOSIS — M25562 Pain in left knee: Secondary | ICD-10-CM | POA: Diagnosis not present

## 2022-05-22 DIAGNOSIS — M25562 Pain in left knee: Secondary | ICD-10-CM | POA: Diagnosis not present

## 2022-06-12 DIAGNOSIS — M25562 Pain in left knee: Secondary | ICD-10-CM | POA: Diagnosis not present

## 2022-06-19 DIAGNOSIS — M25562 Pain in left knee: Secondary | ICD-10-CM | POA: Diagnosis not present

## 2022-06-21 DIAGNOSIS — K6289 Other specified diseases of anus and rectum: Secondary | ICD-10-CM | POA: Diagnosis not present

## 2022-06-21 DIAGNOSIS — M79652 Pain in left thigh: Secondary | ICD-10-CM | POA: Diagnosis not present

## 2022-06-26 DIAGNOSIS — M25562 Pain in left knee: Secondary | ICD-10-CM | POA: Diagnosis not present

## 2022-07-10 DIAGNOSIS — M25562 Pain in left knee: Secondary | ICD-10-CM | POA: Diagnosis not present

## 2022-08-06 IMAGING — US US SCROTUM W/ DOPPLER COMPLETE
1 series · 14 of 25 positions shown · non-contrast
Comparison: None.

CLINICAL DATA: Left testicular pain.

EXAM:
SCROTAL ULTRASOUND
DOPPLER ULTRASOUND OF THE TESTICLES
TECHNIQUE: Complete ultrasound examination of the testicles, epididymis, and
other scrotal structures was performed. Color and spectral Doppler
ultrasound were also utilized to evaluate blood flow to the
testicles.

[Series 1: us scrotum w/ doppler complete · 73 acquisitions, 14 frames shown]
[im 1/73]
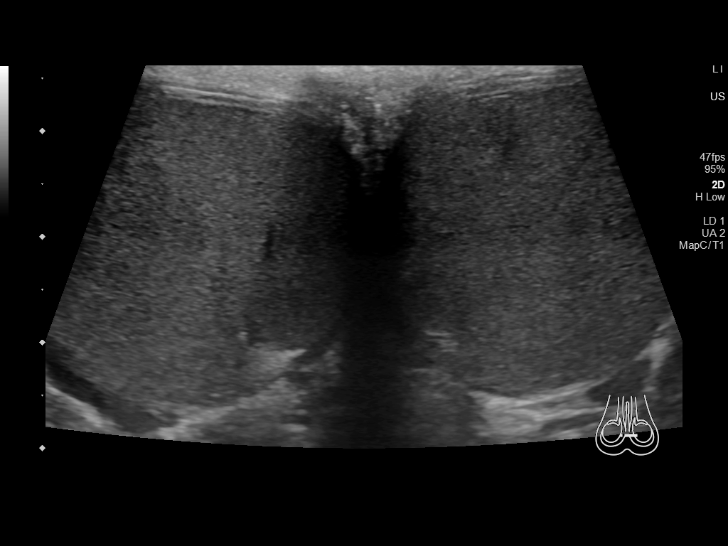
[im 7/73]
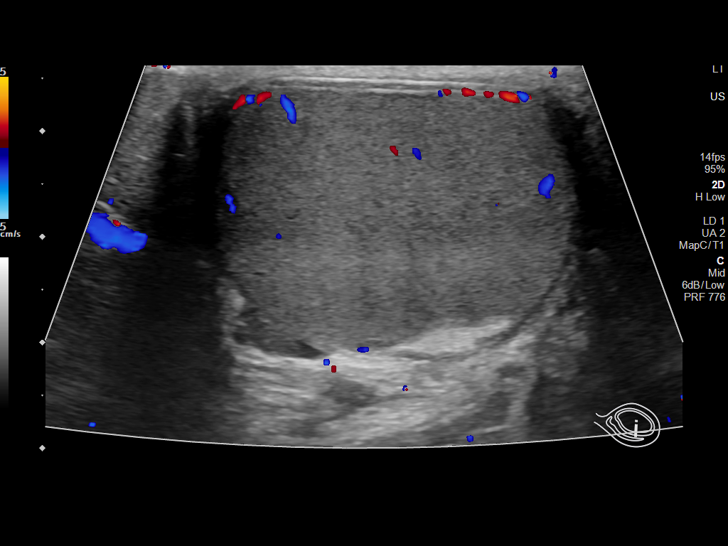
[im 13/73]
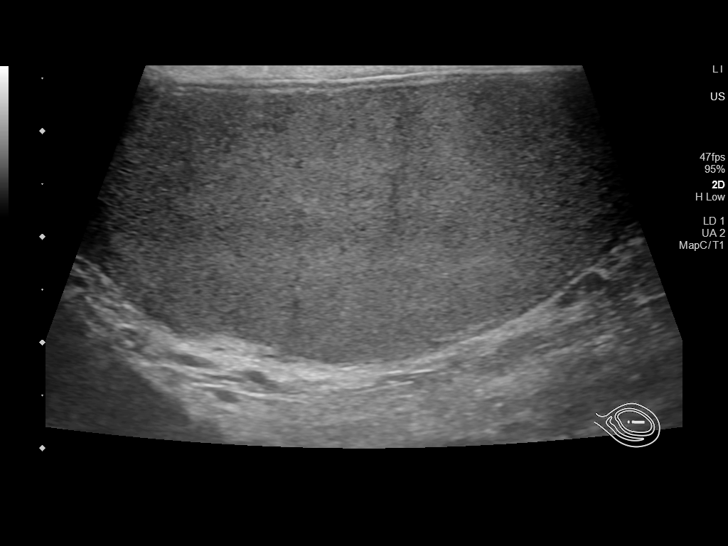
[im 19/73]
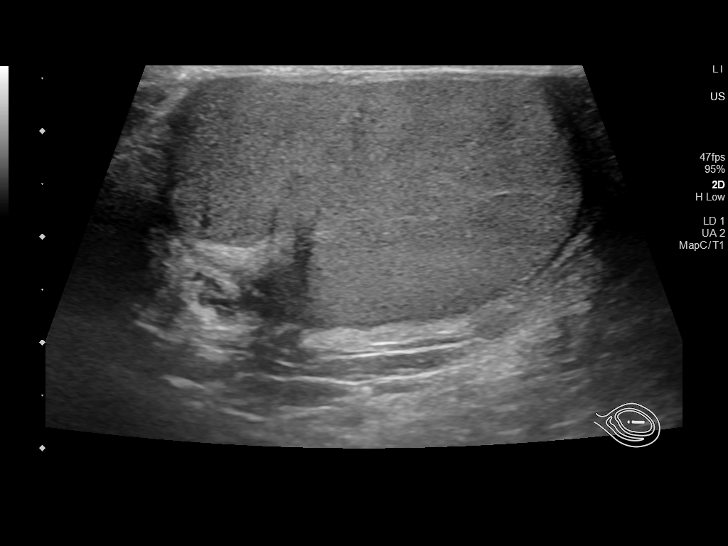
[im 25/73]
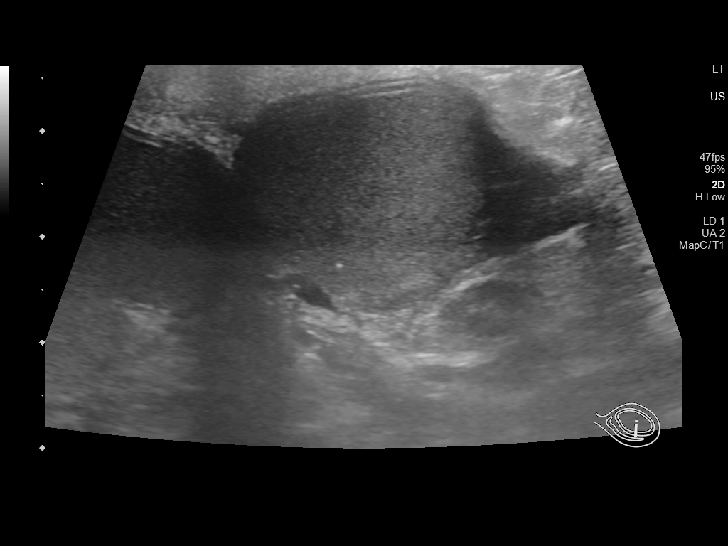
[im 28/73]
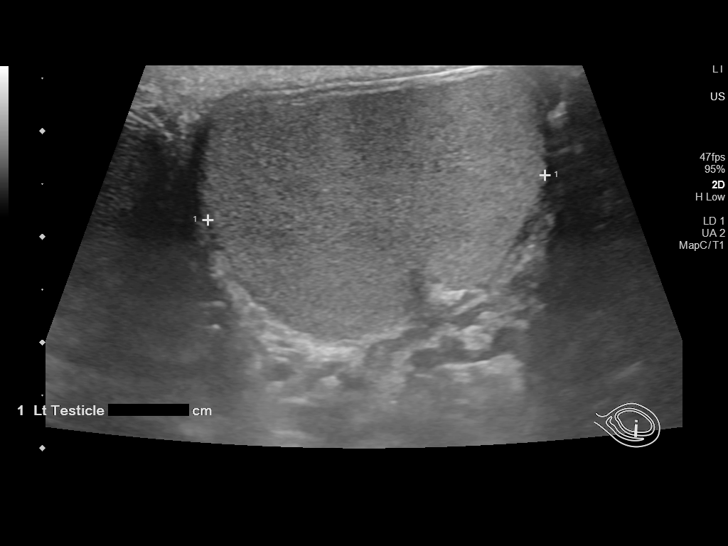
[im 34/73]
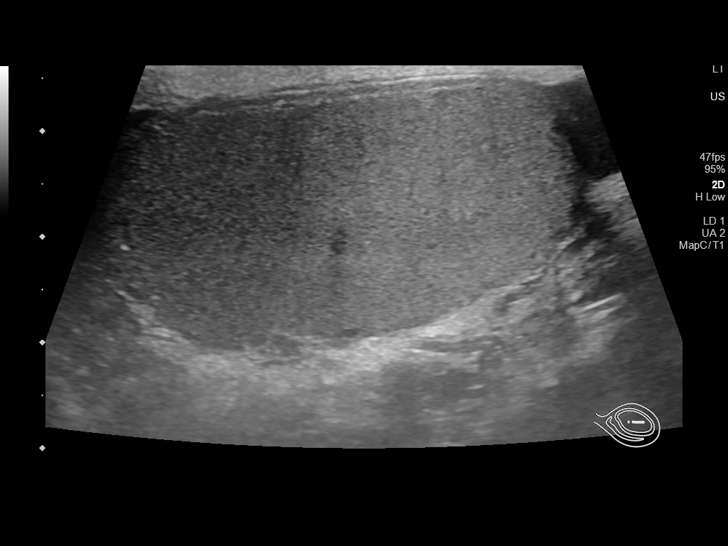
[im 40/73]
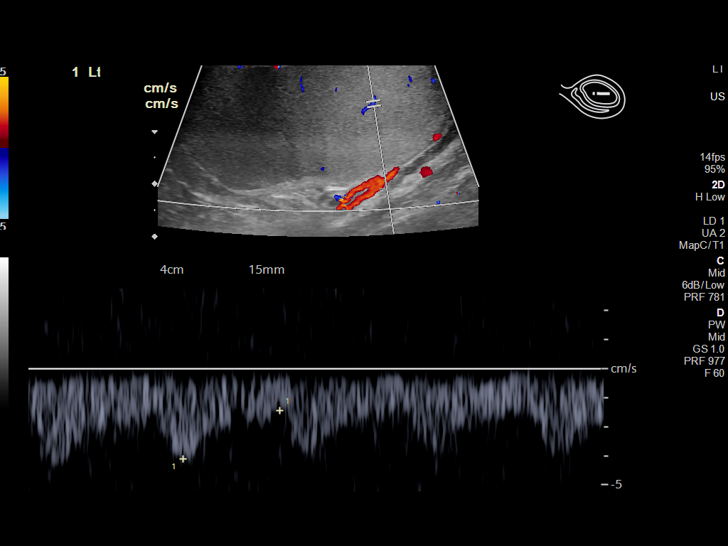
[im 46/73]
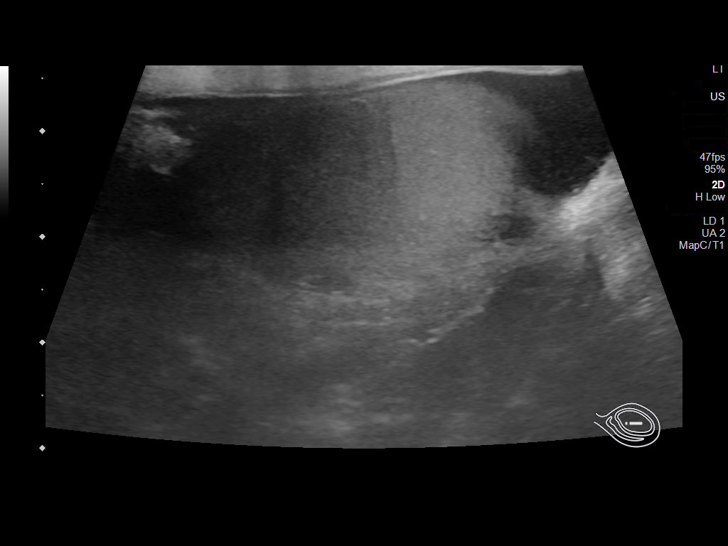
[im 49/73]
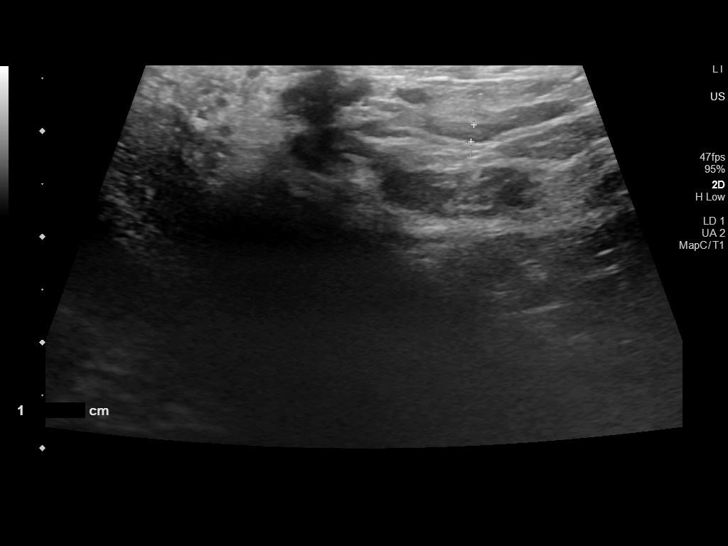
[im 55/73]
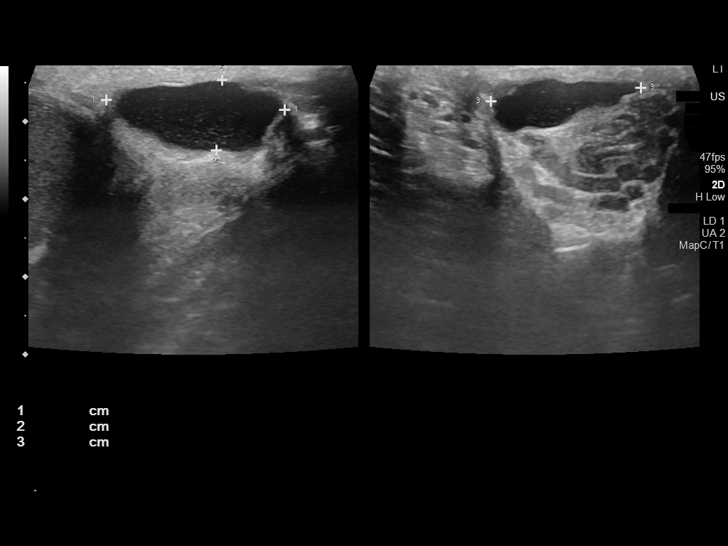
[im 61/73]
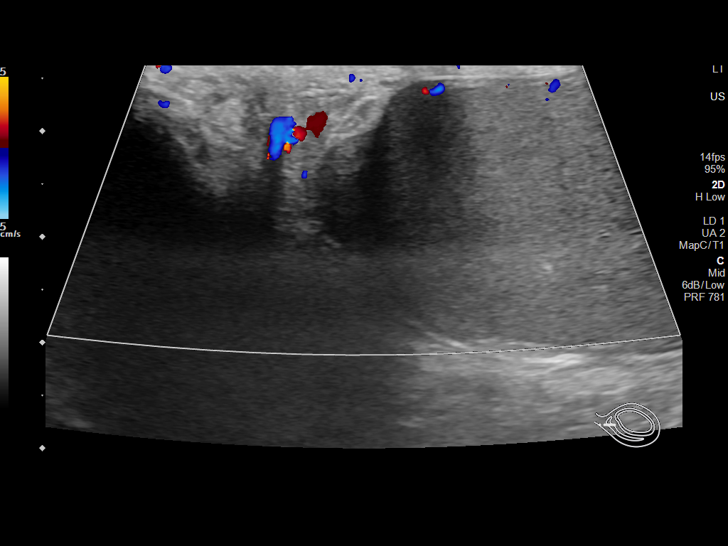
[im 67/73]
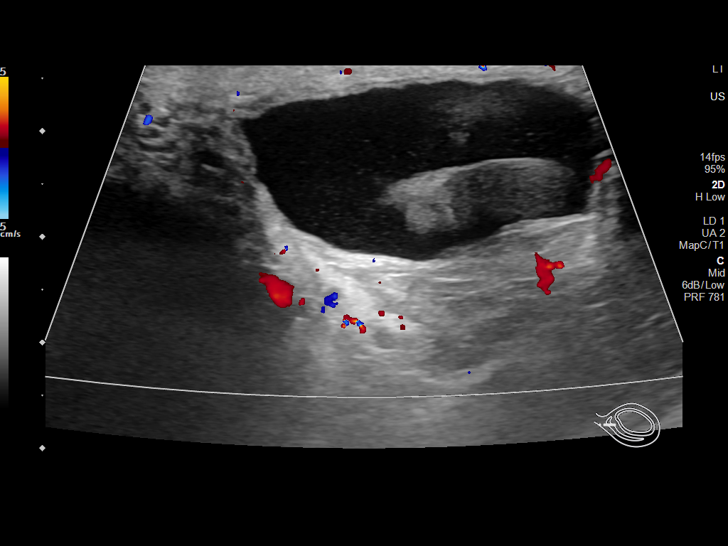
[im 73/73]
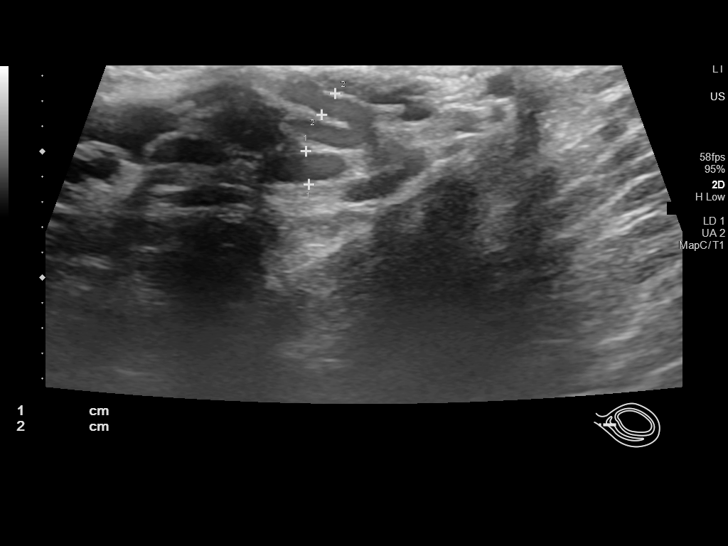

[14 of 25 positions shown; findings below may reference images not displayed]

FINDINGS: Right testicle

Measurements: 5.1 x 2.6 x 3.4 cm. No mass or microlithiasis
visualized.

Left testicle

Measurements: 5.2 x 2.3 x 3.2 cm. No mass or microlithiasis
visualized.

Right epididymis:  Normal in size and appearance.

Left epididymis:  Normal in size and appearance.

Hydrocele:  Bilateral hydroceles are noted.

Varicocele:  Right varicocele is identified.

Pulsed Doppler interrogation of both testes demonstrates normal low
resistance arterial and venous waveforms bilaterally.
IMPRESSION: No evidence of testicular torsion.

Bilateral hydroceles and right varicocele.

## 2023-01-01 DIAGNOSIS — I10 Essential (primary) hypertension: Secondary | ICD-10-CM | POA: Diagnosis not present

## 2023-01-01 DIAGNOSIS — E669 Obesity, unspecified: Secondary | ICD-10-CM | POA: Diagnosis not present

## 2023-01-01 DIAGNOSIS — Z0189 Encounter for other specified special examinations: Secondary | ICD-10-CM | POA: Diagnosis not present

## 2023-01-01 DIAGNOSIS — Z6837 Body mass index (BMI) 37.0-37.9, adult: Secondary | ICD-10-CM | POA: Diagnosis not present

## 2023-04-15 DIAGNOSIS — R5383 Other fatigue: Secondary | ICD-10-CM | POA: Diagnosis not present

## 2023-04-15 DIAGNOSIS — Z03818 Encounter for observation for suspected exposure to other biological agents ruled out: Secondary | ICD-10-CM | POA: Diagnosis not present

## 2023-04-15 DIAGNOSIS — R051 Acute cough: Secondary | ICD-10-CM | POA: Diagnosis not present

## 2023-04-15 DIAGNOSIS — J101 Influenza due to other identified influenza virus with other respiratory manifestations: Secondary | ICD-10-CM | POA: Diagnosis not present

## 2023-04-15 DIAGNOSIS — R52 Pain, unspecified: Secondary | ICD-10-CM | POA: Diagnosis not present
